# Patient Record
Sex: Male | Born: 1960 | Race: Black or African American | Hispanic: No | Marital: Single | Smoking: Former smoker
Health system: Southern US, Community
[De-identification: ages and names within clinical notes are randomized; demographics above are authoritative.]

## PROBLEM LIST (undated history)

## (undated) DIAGNOSIS — I509 Heart failure, unspecified: Secondary | ICD-10-CM

## (undated) DIAGNOSIS — E119 Type 2 diabetes mellitus without complications: Secondary | ICD-10-CM

## (undated) DIAGNOSIS — I2510 Atherosclerotic heart disease of native coronary artery without angina pectoris: Secondary | ICD-10-CM

## (undated) DIAGNOSIS — I82409 Acute embolism and thrombosis of unspecified deep veins of unspecified lower extremity: Secondary | ICD-10-CM

## (undated) DIAGNOSIS — I4891 Unspecified atrial fibrillation: Secondary | ICD-10-CM

## (undated) DIAGNOSIS — I10 Essential (primary) hypertension: Secondary | ICD-10-CM

## (undated) HISTORY — PX: NO PAST SURGERIES: SHX2092

---

## 2005-10-05 ENCOUNTER — Emergency Department: Payer: Self-pay | Admitting: Emergency Medicine

## 2009-01-28 ENCOUNTER — Ambulatory Visit: Payer: Self-pay | Admitting: Family Medicine

## 2010-02-15 ENCOUNTER — Inpatient Hospital Stay: Payer: Self-pay | Admitting: Internal Medicine

## 2011-11-13 ENCOUNTER — Inpatient Hospital Stay: Payer: Self-pay | Admitting: Internal Medicine

## 2011-11-13 LAB — HEMOGLOBIN A1C: Hemoglobin A1C: 13.8 % — ABNORMAL HIGH (ref 4.2–6.3)

## 2011-11-13 LAB — MAGNESIUM: Magnesium: 1.5 mg/dL — ABNORMAL LOW

## 2011-11-13 LAB — TROPONIN I: Troponin-I: 0.02 ng/mL

## 2011-11-14 LAB — CBC WITH DIFFERENTIAL/PLATELET
Basophil #: 0.1 10*3/uL (ref 0.0–0.1)
Basophil %: 0.9 %
Eosinophil #: 0 10*3/uL (ref 0.0–0.7)
HCT: 43.2 % (ref 40.0–52.0)
HGB: 14.3 g/dL (ref 13.0–18.0)
Lymphocyte %: 33.6 %
MCH: 31.7 pg (ref 26.0–34.0)
MCHC: 33.2 g/dL (ref 32.0–36.0)
Monocyte #: 0.6 x10 3/mm (ref 0.2–1.0)
Neutrophil #: 3.3 10*3/uL (ref 1.4–6.5)
Neutrophil %: 56 %
RDW: 14.8 % — ABNORMAL HIGH (ref 11.5–14.5)

## 2011-11-14 LAB — BASIC METABOLIC PANEL
BUN: 17 mg/dL (ref 7–18)
Calcium, Total: 7.8 mg/dL — ABNORMAL LOW (ref 8.5–10.1)
Chloride: 101 mmol/L (ref 98–107)
Creatinine: 1.68 mg/dL — ABNORMAL HIGH (ref 0.60–1.30)
EGFR (African American): 53 — ABNORMAL LOW
EGFR (Non-African Amer.): 46 — ABNORMAL LOW
Glucose: 310 mg/dL — ABNORMAL HIGH (ref 65–99)
Potassium: 3.7 mmol/L (ref 3.5–5.1)
Sodium: 135 mmol/L — ABNORMAL LOW (ref 136–145)

## 2011-11-14 LAB — MAGNESIUM
Magnesium: 2 mg/dL
Magnesium: 2 mg/dL

## 2011-11-14 LAB — PROTIME-INR
INR: 1.9
Prothrombin Time: 22.4 secs — ABNORMAL HIGH (ref 11.5–14.7)

## 2011-11-14 LAB — COMPREHENSIVE METABOLIC PANEL
Alkaline Phosphatase: 59 U/L (ref 50–136)
Anion Gap: 9 (ref 7–16)
BUN: 12 mg/dL (ref 7–18)
Calcium, Total: 7.9 mg/dL — ABNORMAL LOW (ref 8.5–10.1)
Creatinine: 0.97 mg/dL (ref 0.60–1.30)
EGFR (African American): >60
EGFR (Non-African Amer.): >60
Glucose: 206 mg/dL — ABNORMAL HIGH (ref 65–99)
Osmolality: 281 (ref 275–301)
Potassium: 3.3 mmol/L — ABNORMAL LOW (ref 3.5–5.1)
SGPT (ALT): 37 U/L (ref 12–78)
Sodium: 138 mmol/L (ref 136–145)

## 2011-11-15 LAB — BASIC METABOLIC PANEL
Anion Gap: 6 — ABNORMAL LOW (ref 7–16)
BUN: 17 mg/dL (ref 7–18)
Chloride: 104 mmol/L (ref 98–107)
Co2: 27 mmol/L (ref 21–32)
Osmolality: 278 (ref 275–301)
Potassium: 3.4 mmol/L — ABNORMAL LOW (ref 3.5–5.1)
Sodium: 137 mmol/L (ref 136–145)

## 2011-11-16 LAB — BASIC METABOLIC PANEL
Anion Gap: 8 (ref 7–16)
Calcium, Total: 8.3 mg/dL — ABNORMAL LOW (ref 8.5–10.1)
Chloride: 106 mmol/L (ref 98–107)
Co2: 24 mmol/L (ref 21–32)
Creatinine: 1.13 mg/dL (ref 0.60–1.30)
EGFR (African American): >60
EGFR (Non-African Amer.): >60
Osmolality: 282 (ref 275–301)

## 2011-11-17 LAB — PROTIME-INR
INR: 1.6
Prothrombin Time: 19.7 secs — ABNORMAL HIGH (ref 11.5–14.7)

## 2011-11-18 LAB — BASIC METABOLIC PANEL
BUN: 21 mg/dL — ABNORMAL HIGH (ref 7–18)
Calcium, Total: 8.3 mg/dL — ABNORMAL LOW (ref 8.5–10.1)
Co2: 23 mmol/L (ref 21–32)
Creatinine: 1.07 mg/dL (ref 0.60–1.30)
EGFR (Non-African Amer.): >60
Glucose: 116 mg/dL — ABNORMAL HIGH (ref 65–99)
Osmolality: 278 (ref 275–301)
Potassium: 3.9 mmol/L (ref 3.5–5.1)
Sodium: 137 mmol/L (ref 136–145)

## 2012-02-17 ENCOUNTER — Observation Stay: Payer: Self-pay | Admitting: Student

## 2012-02-17 LAB — URINALYSIS, COMPLETE
Bacteria: NONE SEEN
Glucose,UR: >=500 mg/dL (ref 0–75)
Leukocyte Esterase: NEGATIVE
Nitrite: NEGATIVE
RBC,UR: 1 /HPF (ref 0–5)
Squamous Epithelial: NONE SEEN
WBC UR: 1 /HPF (ref 0–5)

## 2012-02-17 LAB — TROPONIN I: Troponin-I: <0.02 ng/mL

## 2012-02-17 LAB — PROTIME-INR
INR: 1.9
Prothrombin Time: 21.7 secs — ABNORMAL HIGH (ref 11.5–14.7)

## 2012-02-17 LAB — LIPID PANEL
Cholesterol: 172 mg/dL (ref 0–200)
HDL Cholesterol: 52 mg/dL (ref 40–60)
Ldl Cholesterol, Calc: 87 mg/dL (ref 0–100)
Triglycerides: 167 mg/dL (ref 0–200)

## 2012-02-17 LAB — COMPREHENSIVE METABOLIC PANEL
Albumin: 3.4 g/dL (ref 3.4–5.0)
Alkaline Phosphatase: 78 U/L (ref 50–136)
Bilirubin,Total: 0.5 mg/dL (ref 0.2–1.0)
Co2: 28 mmol/L (ref 21–32)
EGFR (African American): >60
EGFR (Non-African Amer.): >60
SGOT(AST): 18 U/L (ref 15–37)
SGPT (ALT): 21 U/L (ref 12–78)
Total Protein: 7.9 g/dL (ref 6.4–8.2)

## 2012-02-17 LAB — CBC
HCT: 45.9 % (ref 40.0–52.0)
MCH: 31 pg (ref 26.0–34.0)
MCV: 91 fL (ref 80–100)
Platelet: 223 10*3/uL (ref 150–440)
RBC: 5.03 10*6/uL (ref 4.40–5.90)
RDW: 16.9 % — ABNORMAL HIGH (ref 11.5–14.5)

## 2012-02-17 LAB — HEMOGLOBIN A1C: Hemoglobin A1C: 14.5 % — ABNORMAL HIGH (ref 4.2–6.3)

## 2012-02-18 LAB — LIPID PANEL
Cholesterol: 168 mg/dL (ref 0–200)
HDL Cholesterol: 52 mg/dL (ref 40–60)
Ldl Cholesterol, Calc: 92 mg/dL (ref 0–100)
Triglycerides: 122 mg/dL (ref 0–200)

## 2013-09-28 ENCOUNTER — Emergency Department: Payer: Self-pay | Admitting: Emergency Medicine

## 2013-10-11 ENCOUNTER — Inpatient Hospital Stay: Payer: Self-pay | Admitting: Internal Medicine

## 2013-10-11 LAB — COMPREHENSIVE METABOLIC PANEL
ALT: 28 U/L (ref 12–78)
Albumin: 3 g/dL — ABNORMAL LOW (ref 3.4–5.0)
Alkaline Phosphatase: 72 U/L
Anion Gap: 9 (ref 7–16)
BUN: 11 mg/dL (ref 7–18)
Bilirubin,Total: 1.7 mg/dL — ABNORMAL HIGH (ref 0.2–1.0)
CREATININE: 1.58 mg/dL — AB (ref 0.60–1.30)
Calcium, Total: 7.7 mg/dL — ABNORMAL LOW (ref 8.5–10.1)
Chloride: 105 mmol/L (ref 98–107)
Co2: 22 mmol/L (ref 21–32)
EGFR (African American): 57 — ABNORMAL LOW
EGFR (Non-African Amer.): 49 — ABNORMAL LOW
GLUCOSE: 217 mg/dL — AB (ref 65–99)
Osmolality: 278 (ref 275–301)
POTASSIUM: 4.8 mmol/L (ref 3.5–5.1)
SGOT(AST): 33 U/L (ref 15–37)
Sodium: 136 mmol/L (ref 136–145)
Total Protein: 7 g/dL (ref 6.4–8.2)

## 2013-10-11 LAB — BASIC METABOLIC PANEL
ANION GAP: 8 (ref 7–16)
Anion Gap: 12 (ref 7–16)
BUN: 12 mg/dL (ref 7–18)
BUN: 14 mg/dL (ref 7–18)
CHLORIDE: 102 mmol/L (ref 98–107)
CO2: 19 mmol/L — AB (ref 21–32)
CO2: 20 mmol/L — AB (ref 21–32)
CREATININE: 1.6 mg/dL — AB (ref 0.60–1.30)
Calcium, Total: 7.1 mg/dL — ABNORMAL LOW (ref 8.5–10.1)
Calcium, Total: 8 mg/dL — ABNORMAL LOW (ref 8.5–10.1)
Chloride: 100 mmol/L (ref 98–107)
Creatinine: 1.68 mg/dL — ABNORMAL HIGH (ref 0.60–1.30)
EGFR (Non-African Amer.): 45 — ABNORMAL LOW
EGFR (Non-African Amer.): 48 — ABNORMAL LOW
GFR CALC AF AMER: 53 — AB
GFR CALC AF AMER: 56 — AB
GLUCOSE: 272 mg/dL — AB (ref 65–99)
Glucose: 324 mg/dL — ABNORMAL HIGH (ref 65–99)
OSMOLALITY: 268 (ref 275–301)
Osmolality: 278 (ref 275–301)
Potassium: 7.2 mmol/L (ref 3.5–5.1)
Potassium: 4.3 mmol/L (ref 3.5–5.1)
SODIUM: 129 mmol/L — AB (ref 136–145)
Sodium: 132 mmol/L — ABNORMAL LOW (ref 136–145)

## 2013-10-11 LAB — CBC
HCT: 46.7 % (ref 40.0–52.0)
HGB: 14.9 g/dL (ref 13.0–18.0)
MCH: 31.4 pg (ref 26.0–34.0)
MCHC: 31.9 g/dL — AB (ref 32.0–36.0)
MCV: 98 fL (ref 80–100)
Platelet: 245 10*3/uL (ref 150–440)
RBC: 4.74 10*6/uL (ref 4.40–5.90)
RDW: 14.6 % — AB (ref 11.5–14.5)
WBC: 6.8 10*3/uL (ref 3.8–10.6)

## 2013-10-11 LAB — URINALYSIS, COMPLETE
BACTERIA: NONE SEEN
Bilirubin,UR: NEGATIVE
Blood: NEGATIVE
Glucose,UR: NEGATIVE mg/dL (ref 0–75)
Ketone: NEGATIVE
LEUKOCYTE ESTERASE: NEGATIVE
Nitrite: NEGATIVE
Ph: 5 (ref 4.5–8.0)
Protein: 30
SPECIFIC GRAVITY: 1.02 (ref 1.003–1.030)
Squamous Epithelial: NONE SEEN
WBC UR: 1 /HPF (ref 0–5)

## 2013-10-11 LAB — TROPONIN I
TROPONIN-I: 0.02 ng/mL
Troponin-I: 0.02 ng/mL

## 2013-10-11 LAB — DIGOXIN LEVEL: Digoxin: 1.45 ng/mL

## 2013-10-11 LAB — D-DIMER(ARMC): D-Dimer: 1781 ng/ml

## 2013-10-11 LAB — PROTIME-INR
INR: 1.4
PROTHROMBIN TIME: 16.6 s — AB (ref 11.5–14.7)

## 2013-10-11 LAB — CK TOTAL AND CKMB (NOT AT ARMC)
CK, TOTAL: 268 U/L
CK-MB: 4.7 ng/mL — ABNORMAL HIGH (ref 0.5–3.6)

## 2013-10-11 LAB — PRO B NATRIURETIC PEPTIDE: B-TYPE NATIURETIC PEPTID: 1023 pg/mL — AB (ref 0–125)

## 2013-10-12 ENCOUNTER — Ambulatory Visit: Payer: Self-pay | Admitting: Neurology

## 2013-10-12 LAB — BASIC METABOLIC PANEL
ANION GAP: 10 (ref 7–16)
BUN: 13 mg/dL (ref 7–18)
CO2: 22 mmol/L (ref 21–32)
Calcium, Total: 7.7 mg/dL — ABNORMAL LOW (ref 8.5–10.1)
Chloride: 108 mmol/L — ABNORMAL HIGH (ref 98–107)
Creatinine: 1.58 mg/dL — ABNORMAL HIGH (ref 0.60–1.30)
EGFR (African American): 57 — ABNORMAL LOW
EGFR (Non-African Amer.): 49 — ABNORMAL LOW
GLUCOSE: 177 mg/dL — AB (ref 65–99)
OSMOLALITY: 284 (ref 275–301)
Potassium: 3.7 mmol/L (ref 3.5–5.1)
SODIUM: 140 mmol/L (ref 136–145)

## 2013-10-12 LAB — CBC WITH DIFFERENTIAL/PLATELET
Basophil #: 0.1 10*3/uL (ref 0.0–0.1)
Basophil %: 0.7 %
EOS PCT: 0.1 %
Eosinophil #: 0 10*3/uL (ref 0.0–0.7)
HCT: 43.3 % (ref 40.0–52.0)
HGB: 14.2 g/dL (ref 13.0–18.0)
LYMPHS PCT: 17.7 %
Lymphocyte #: 1.7 10*3/uL (ref 1.0–3.6)
MCH: 31.7 pg (ref 26.0–34.0)
MCHC: 32.8 g/dL (ref 32.0–36.0)
MCV: 96 fL (ref 80–100)
Monocyte #: 0.4 x10 3/mm (ref 0.2–1.0)
Monocyte %: 4 %
NEUTROS PCT: 77.5 %
Neutrophil #: 7.4 10*3/uL — ABNORMAL HIGH (ref 1.4–6.5)
PLATELETS: 165 10*3/uL (ref 150–440)
RBC: 4.49 10*6/uL (ref 4.40–5.90)
RDW: 14.6 % — ABNORMAL HIGH (ref 11.5–14.5)
WBC: 9.5 10*3/uL (ref 3.8–10.6)

## 2013-10-12 LAB — TSH: Thyroid Stimulating Horm: 0.533 u[IU]/mL

## 2013-10-12 LAB — MAGNESIUM
Magnesium: 1.5 mg/dL — ABNORMAL LOW
Magnesium: 2 mg/dL

## 2013-10-13 LAB — BASIC METABOLIC PANEL
Anion Gap: 8 (ref 7–16)
BUN: 10 mg/dL (ref 7–18)
CHLORIDE: 107 mmol/L (ref 98–107)
CO2: 25 mmol/L (ref 21–32)
Calcium, Total: 7.3 mg/dL — ABNORMAL LOW (ref 8.5–10.1)
Creatinine: 1.09 mg/dL (ref 0.60–1.30)
GLUCOSE: 120 mg/dL — AB (ref 65–99)
Osmolality: 280 (ref 275–301)
POTASSIUM: 3.4 mmol/L — AB (ref 3.5–5.1)
Sodium: 140 mmol/L (ref 136–145)

## 2013-10-13 LAB — CBC WITH DIFFERENTIAL/PLATELET
Basophil #: 0.1 10*3/uL (ref 0.0–0.1)
Basophil %: 0.9 %
Eosinophil #: 0 10*3/uL (ref 0.0–0.7)
Eosinophil %: 0.2 %
HCT: 43.3 % (ref 40.0–52.0)
HGB: 13.9 g/dL (ref 13.0–18.0)
LYMPHS ABS: 1.3 10*3/uL (ref 1.0–3.6)
Lymphocyte %: 15.5 %
MCH: 31.4 pg (ref 26.0–34.0)
MCHC: 32.1 g/dL (ref 32.0–36.0)
MCV: 98 fL (ref 80–100)
MONOS PCT: 4.4 %
Monocyte #: 0.4 x10 3/mm (ref 0.2–1.0)
NEUTROS ABS: 6.7 10*3/uL — AB (ref 1.4–6.5)
Neutrophil %: 79 %
Platelet: 144 10*3/uL — ABNORMAL LOW (ref 150–440)
RBC: 4.43 10*6/uL (ref 4.40–5.90)
RDW: 15.1 % — AB (ref 11.5–14.5)
WBC: 8.5 10*3/uL (ref 3.8–10.6)

## 2013-10-13 LAB — URINALYSIS, COMPLETE
Bacteria: NONE SEEN
Bilirubin,UR: NEGATIVE
Glucose,UR: NEGATIVE mg/dL (ref 0–75)
KETONE: NEGATIVE
Leukocyte Esterase: NEGATIVE
Nitrite: NEGATIVE
Ph: 5 (ref 4.5–8.0)
Protein: 30
Specific Gravity: 1.026 (ref 1.003–1.030)
Squamous Epithelial: NONE SEEN
WBC UR: 1 /HPF (ref 0–5)

## 2013-10-14 LAB — BASIC METABOLIC PANEL
ANION GAP: 8 (ref 7–16)
BUN: 9 mg/dL (ref 7–18)
CALCIUM: 7.4 mg/dL — AB (ref 8.5–10.1)
CREATININE: 1.29 mg/dL (ref 0.60–1.30)
Chloride: 104 mmol/L (ref 98–107)
Co2: 27 mmol/L (ref 21–32)
EGFR (African American): >60
EGFR (Non-African Amer.): >60
GLUCOSE: 164 mg/dL — AB (ref 65–99)
OSMOLALITY: 280 (ref 275–301)
Potassium: 3.7 mmol/L (ref 3.5–5.1)
SODIUM: 139 mmol/L (ref 136–145)

## 2013-10-14 LAB — URINALYSIS, COMPLETE
BACTERIA: NONE SEEN
BILIRUBIN, UR: NEGATIVE
Glucose,UR: NEGATIVE mg/dL (ref 0–75)
Ketone: NEGATIVE
Leukocyte Esterase: NEGATIVE
Nitrite: NEGATIVE
Ph: 5 (ref 4.5–8.0)
Protein: NEGATIVE
SQUAMOUS EPITHELIAL: NONE SEEN
Specific Gravity: 1.01 (ref 1.003–1.030)
WBC UR: 4 /HPF (ref 0–5)

## 2013-10-14 LAB — HEPATIC FUNCTION PANEL A (ARMC)
Albumin: 2.4 g/dL — ABNORMAL LOW (ref 3.4–5.0)
Alkaline Phosphatase: 82 U/L
BILIRUBIN TOTAL: 1.7 mg/dL — AB (ref 0.2–1.0)
Bilirubin, Direct: 1 mg/dL — ABNORMAL HIGH (ref 0.00–0.20)
SGOT(AST): 314 U/L — ABNORMAL HIGH (ref 15–37)
SGPT (ALT): 916 U/L — ABNORMAL HIGH (ref 12–78)
TOTAL PROTEIN: 6.8 g/dL (ref 6.4–8.2)

## 2013-10-14 LAB — PHOSPHORUS: Phosphorus: 3 mg/dL (ref 2.5–4.9)

## 2013-10-14 LAB — MAGNESIUM: Magnesium: 2 mg/dL

## 2013-10-14 LAB — URINE CULTURE

## 2013-10-14 LAB — CK: CK, TOTAL: 101 U/L

## 2013-10-15 LAB — BASIC METABOLIC PANEL
Anion Gap: 6 — ABNORMAL LOW (ref 7–16)
BUN: 7 mg/dL (ref 7–18)
CO2: 29 mmol/L (ref 21–32)
CREATININE: 1.13 mg/dL (ref 0.60–1.30)
Calcium, Total: 7.6 mg/dL — ABNORMAL LOW (ref 8.5–10.1)
Chloride: 103 mmol/L (ref 98–107)
EGFR (African American): >60
GLUCOSE: 178 mg/dL — AB (ref 65–99)
OSMOLALITY: 278 (ref 275–301)
POTASSIUM: 3.3 mmol/L — AB (ref 3.5–5.1)
SODIUM: 138 mmol/L (ref 136–145)

## 2013-10-15 LAB — CBC WITH DIFFERENTIAL/PLATELET
BASOS ABS: 0 10*3/uL (ref 0.0–0.1)
Basophil %: 0.3 %
EOS PCT: 0.2 %
Eosinophil #: 0 10*3/uL (ref 0.0–0.7)
HCT: 42 % (ref 40.0–52.0)
HGB: 13.6 g/dL (ref 13.0–18.0)
LYMPHS ABS: 0.8 10*3/uL — AB (ref 1.0–3.6)
Lymphocyte %: 10.6 %
MCH: 31.6 pg (ref 26.0–34.0)
MCHC: 32.5 g/dL (ref 32.0–36.0)
MCV: 97 fL (ref 80–100)
MONO ABS: 0.7 x10 3/mm (ref 0.2–1.0)
Monocyte %: 9.5 %
NEUTROS ABS: 5.8 10*3/uL (ref 1.4–6.5)
Neutrophil %: 79.4 %
PLATELETS: 139 10*3/uL — AB (ref 150–440)
RBC: 4.32 10*6/uL — ABNORMAL LOW (ref 4.40–5.90)
RDW: 14.8 % — ABNORMAL HIGH (ref 11.5–14.5)
WBC: 7.3 10*3/uL (ref 3.8–10.6)

## 2013-10-15 LAB — POTASSIUM: POTASSIUM: 3.5 mmol/L (ref 3.5–5.1)

## 2013-10-15 LAB — AMMONIA: Ammonia, Plasma: 36 mcmol/L — ABNORMAL HIGH (ref 11–32)

## 2013-10-16 LAB — BASIC METABOLIC PANEL
ANION GAP: 9 (ref 7–16)
BUN: 8 mg/dL (ref 7–18)
CO2: 30 mmol/L (ref 21–32)
Calcium, Total: 7.7 mg/dL — ABNORMAL LOW (ref 8.5–10.1)
Chloride: 101 mmol/L (ref 98–107)
Creatinine: 0.96 mg/dL (ref 0.60–1.30)
EGFR (Non-African Amer.): >60
Glucose: 137 mg/dL — ABNORMAL HIGH (ref 65–99)
OSMOLALITY: 280 (ref 275–301)
POTASSIUM: 3.5 mmol/L (ref 3.5–5.1)
SODIUM: 140 mmol/L (ref 136–145)

## 2013-10-16 LAB — MAGNESIUM: MAGNESIUM: 2 mg/dL

## 2013-10-16 LAB — PHOSPHORUS: Phosphorus: 1.9 mg/dL — ABNORMAL LOW (ref 2.5–4.9)

## 2013-10-17 LAB — BASIC METABOLIC PANEL
ANION GAP: 7 (ref 7–16)
BUN: 10 mg/dL (ref 7–18)
Calcium, Total: 8.1 mg/dL — ABNORMAL LOW (ref 8.5–10.1)
Chloride: 101 mmol/L (ref 98–107)
Co2: 30 mmol/L (ref 21–32)
Creatinine: 1.01 mg/dL (ref 0.60–1.30)
EGFR (African American): >60
EGFR (Non-African Amer.): >60
GLUCOSE: 174 mg/dL — AB (ref 65–99)
OSMOLALITY: 279 (ref 275–301)
Potassium: 3.1 mmol/L — ABNORMAL LOW (ref 3.5–5.1)
SODIUM: 138 mmol/L (ref 136–145)

## 2013-10-17 LAB — EXPECTORATED SPUTUM ASSESSMENT W REFEX TO RESP CULTURE

## 2013-10-17 LAB — POTASSIUM: POTASSIUM: 4.1 mmol/L (ref 3.5–5.1)

## 2013-10-17 LAB — PHOSPHORUS
PHOSPHORUS: 1.6 mg/dL — AB (ref 2.5–4.9)
PHOSPHORUS: 1.9 mg/dL — AB (ref 2.5–4.9)
Phosphorus: 1.9 mg/dL — ABNORMAL LOW (ref 2.5–4.9)

## 2013-10-17 LAB — MAGNESIUM: Magnesium: 2.2 mg/dL

## 2013-10-18 LAB — BASIC METABOLIC PANEL
Anion Gap: 7 (ref 7–16)
BUN: 16 mg/dL (ref 7–18)
CHLORIDE: 102 mmol/L (ref 98–107)
Calcium, Total: 8.2 mg/dL — ABNORMAL LOW (ref 8.5–10.1)
Co2: 30 mmol/L (ref 21–32)
Creatinine: 1.11 mg/dL (ref 0.60–1.30)
EGFR (Non-African Amer.): >60
GLUCOSE: 168 mg/dL — AB (ref 65–99)
Osmolality: 283 (ref 275–301)
POTASSIUM: 3.4 mmol/L — AB (ref 3.5–5.1)
SODIUM: 139 mmol/L (ref 136–145)

## 2013-10-18 LAB — PHOSPHORUS
PHOSPHORUS: 2.4 mg/dL — AB (ref 2.5–4.9)
Phosphorus: 2.4 mg/dL — ABNORMAL LOW (ref 2.5–4.9)

## 2013-10-18 LAB — MAGNESIUM: Magnesium: 2.1 mg/dL

## 2013-10-19 LAB — CULTURE, BLOOD (SINGLE)

## 2013-10-19 LAB — POTASSIUM: POTASSIUM: 3 mmol/L — AB (ref 3.5–5.1)

## 2013-10-19 LAB — SODIUM: SODIUM: 141 mmol/L (ref 136–145)

## 2013-10-19 LAB — PHOSPHORUS: Phosphorus: 2 mg/dL — ABNORMAL LOW (ref 2.5–4.9)

## 2013-10-19 LAB — CALCIUM: CALCIUM: 8.4 mg/dL — AB (ref 8.5–10.1)

## 2013-10-19 LAB — MAGNESIUM: Magnesium: 2 mg/dL

## 2013-10-20 LAB — BASIC METABOLIC PANEL
ANION GAP: 8 (ref 7–16)
BUN: 14 mg/dL (ref 7–18)
CHLORIDE: 105 mmol/L (ref 98–107)
Calcium, Total: 8.6 mg/dL (ref 8.5–10.1)
Co2: 29 mmol/L (ref 21–32)
Creatinine: 1.02 mg/dL (ref 0.60–1.30)
EGFR (African American): >60
Glucose: 149 mg/dL — ABNORMAL HIGH (ref 65–99)
Osmolality: 286 (ref 275–301)
POTASSIUM: 3 mmol/L — AB (ref 3.5–5.1)
Sodium: 142 mmol/L (ref 136–145)

## 2013-10-20 LAB — DIGOXIN LEVEL: Digoxin: 0.82 ng/mL

## 2013-10-20 LAB — PHOSPHORUS: PHOSPHORUS: 2.4 mg/dL — AB (ref 2.5–4.9)

## 2013-10-20 LAB — MAGNESIUM: Magnesium: 1.8 mg/dL

## 2013-10-20 LAB — HEMOGLOBIN: HGB: 14.3 g/dL (ref 13.0–18.0)

## 2014-04-12 ENCOUNTER — Emergency Department: Payer: Self-pay | Admitting: Physician Assistant

## 2014-07-14 NOTE — Discharge Summary (Signed)
PATIENT NAME:  Ralph Meadows, Ralph Meadows MR#:  [id number] DATE OF BIRTH:  Apr 14, 1960  DATE OF ADMISSION:  11/13/2011 DATE OF DISCHARGE:  11/18/2011  PRIMARY CARE PHYSICIAN: Dr. Terance Hart CARDIOLOGIST: Dr. Darrold Junker   FINAL DIAGNOSES:  1. Acute systolic congestive heart failure with cardiomyopathy, ejection fraction of 35%.  2. Rapid atrial fibrillation.  3. Hypotension.  4. Diabetes.  5. Obesity.   MEDICATIONS ON DISCHARGE:  1. Cardizem CD 240 mg extended-release 1 capsule daily.  2. Novolin 70/30 insulin 10 units twice a day. 3. Warfarin 7.5 mg today only on 08/24 then go back to 5 mg p.o. daily afterwards. 4. Digoxin 250 mg p.o. daily.  5. Carvedilol 6.25 mg twice a day. 6. Ipratropium albuterol CFC free inhaler 100 mcg/20 mcg inhalation 1 puff 4 times a day as needed for shortness of breath.  7. Furosemide 40 mg daily.  8. Potassium chloride 20 mEq daily.  9. Stop hydrochlorothiazide/lisinopril combination.   DIET: Low sodium, carbohydrate-controlled diet, regular consistency.   ACTIVITY: Activity as tolerated.   FOLLOW UP: Keep appointment with Dr. Darrold Junker in 1 to 2 weeks. Keep appointment with Dr. Terance Hart on Friday. Check Coumadin level on Monday or Tuesday with Dr. Mcarthur Rossetti office.   REASON FOR ADMISSION: Patient was admitted 11/13/2011, discharged 11/18/2011. Came in with rapid atrial fibrillation and congestive heart failure, two days of shortness of breath and wheezing. He had a recent pneumonia that was treated with antibiotics. Over the past few weeks he has gained 20 pounds and can only walk 10 to 20 feet without getting shortness of breath. He was admitted with acute congestive heart failure, started on IV Lasix, initially admitted to telemetry for the rapid atrial fibrillation.   LABORATORY, DIAGNOSTIC AND RADIOLOGICAL DATA: Hemoglobin A1c 13.8. Troponin borderline at 0.02. BNP 655, magnesium 1.5. Chest x-ray showed no definite acute cardiopulmonary disease, mild  cardiomegaly. INR 1.9. Glucose 206, BUN 12, creatinine 0.97, sodium 138, potassium 3.3, chloride 104, CO2 25, calcium 7.9. Liver function tests: Albumin low at 2.8. Other liver function tests normal. White blood cell count 5.9, hemoglobin and hematocrit 14.3 and 43.2, platelet count 226. EKG showed atrial fibrillation, nonspecific T wave abnormality. Echocardiogram showed an ejection fraction of 35%, left ventricular hypertrophy, left atrial enlargement, right ventricular enlargement, mild MR and TR. Last creatinine on the 24th was 1.07. Last potassium 3.9. Last INR on the 23rd was 1.6.   HOSPITAL COURSE PER PROBLEM LIST:  1. For the patient's acute systolic congestive heart failure with cardiomyopathy ejection fraction of 35%, the patient was started on Lasix 40 mg daily, Coreg 6.25 mg twice a day. Unable to give ACE inhibitor secondary to hypotension. Blood pressure 97/68 upon discharge. I think the heart failure was driven by the rapid atrial fibrillation.  2. Rapid atrial fibrillation. The patient needed three medications to control heart rate; digoxin, Cardizem and Coreg. Heart rate ranging between 79 and 101. Close clinical follow up as outpatient needed.  3. Hypotension. We are limited with medications in this gentleman secondary to hypotension. Need to control heart rate which is a priority because I think his heart failure is driven by the fast heart rate. Would ideally like to add a low dose ACE inhibitor for the patient's cardiomyopathy and congestive heart failure but unable to do so in the hospital stay. Hopefully will be able to do so as outpatient.  4. For his diabetes, this is under poor control with hemoglobin A1c being 13.8, although the last few sugars have been good; glucose 116  and 119 and that was on his usual medication this suggests noncompliance.  5. Obesity. BMI 50.6. Weight loss needed.         TIME SPENT ON DISCHARGE: 35 minutes.   ____________________________ Herschell Dimesichard J.  Renae GlossWieting, MD rjw:cms D: 11/18/2011 14:45:52 ET T: 11/18/2011 14:55:18 ET JOB#: [id number]  cc: Herschell Dimesichard J. Renae GlossWieting, MD, <Dictator> Teena Iraniavid M. Terance HartBronstein, MD Marcina MillardAlexander Paraschos, MD  Salley ScarletICHARD J Nayshawn Mesta MD ELECTRONICALLY SIGNED 11/22/2011 16:49

## 2014-07-14 NOTE — Consult Note (Signed)
PATIENT NAME:  Ralph Meadows, Ralph Meadows MR#:  [id number] DATE OF BIRTH:  August 31, 1960  DATE OF CONSULTATION:  11/14/2011  REFERRING PHYSICIAN:  Dr. Cherlynn Kaiser  CONSULTING PHYSICIAN:  Marcina Millard, MD  PRIMARY CARE PHYSICIAN:  Dr. Terance Hart.   CHIEF COMPLAINT: Shortness of breath.   REASON FOR CONSULTATION: Consultation requested for evaluation of congestive heart failure and atrial fibrillation.   HISTORY OF PRESENT ILLNESS: The patient is a 54 year old gentleman with a history of paroxysmal atrial fibrillation, hypertension, diabetes and prior history of stroke. The patient apparently has had a recent history of pneumonia treated as an outpatient. He presented to urgent care clinic on 11/13/2011 with a two week history of increasing shortness of breath with 20 pound weight gain. The patient was admitted to the Critical Care Unit with congestive heart failure where he has been treated with intravenous furosemide. The patient has demonstrated diuresis and clinical improvement. Admission labs were notable for a negative troponin of 0.02 in the absence of chest pain. B-type natriuretic peptide was 655. The patient is in atrial fibrillation.   PAST MEDICAL HISTORY:  1. Paroxysmal atrial fibrillation.  2. Hypertension.  3. Diabetes.  4. Extreme obesity.  5. History of cerebrovascular accident.  6. Hyperlipidemia.   MEDICATIONS:  1. Aspirin 81 mg daily.  2. Diltiazem CD 240 mg daily.  3. Coumadin 5 mg daily.  4. Lipitor 20 mg daily.  5. Metoprolol 10 mg b.i.d.  6. Norvasc 5 mg daily.  7. Glyburide 5 mg b.i.d.  8. Novolin 70/30, 20 units daily.   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 He has a 15 pack-year tobacco abuse history, quit in 2006.   FAMILY HISTORY:  No family history of coronary disease or myocardial infarction.   REVIEW OF SYSTEMS: CONSTITUTIONAL: No fever or chills. EYES: No blurry vision. EARS: No hearing loss. RESPIRATORY: Shortness of breath  as described above. CARDIOVASCULAR: No chest pain. GASTROINTESTINAL: The patient does have some mild nausea. Denies vomiting, diarrhea, or constipation. GU: The patient denies dysuria or hematuria. ENDOCRINE: The patient denies polyuria or polydipsia. MUSCULOSKELETAL: The patient denies arthralgias or myalgias. INTEGUMENT: The patient denies rashes. NEUROLOGICAL: The patient denies focal muscle weakness or numbness. PSYCHOLOGICAL: The patient denies depression or anxiety.   PHYSICAL EXAMINATION:  VITAL SIGNS: Blood pressure 108/65, pulse 110 and irregular, respirations 34, temperature 98.6, pulse oximetry 98%.   HEENT: Pupils equal, reactive to light and accommodation.   NECK: Supple without thyromegaly.   LUNGS: Clear.   HEART: Normal jugular venous pressure. Normal point of maximal impulse. Regular rate and rhythm. Normal S1, S2. No appreciable gallop, murmur, or rub.   ABDOMEN: Soft and nontender.   EXTREMITIES: 1+ bilateral pedal edema.   MUSCULOSKELETAL: Normal muscle tone.   NEUROLOGIC: The patient is alert and oriented x3. Motor and sensory both grossly intact.   IMPRESSION: This is a 54 year old gentleman who presents with a two week history of worsening congestive heart failure, likely diastolic dysfunction with history of obesity, hypertension and diabetes with paroxysmal atrial fibrillation. The patient has ruled out for myocardial infarction with negative troponin.   RECOMMENDATIONS:  1. Agree with overall current therapy.  2. Will continue diuresis with intravenous Lasix.  3. Continue warfarin, the patient has a CHADS2 score of 4.  4. Agree with adding metoprolol for better rate control.  5. Review 2-D echocardiogram.  6. Would defer adding antiarrhythmic therapy at this time.   ____________________________ Marcina Millard, MD ap:ap D: 11/14/2011 04:54:09 ET  T: 11/14/2011 10:46:16 ET JOB#: [id number]  cc: Marcina MillardAlexander Jawaan Adachi, MD, <Dictator> Marcina MillardALEXANDER Drezden Seitzinger  MD ELECTRONICALLY SIGNED 11/29/2011 9:00

## 2014-07-14 NOTE — Discharge Summary (Signed)
PATIENT NAME:  Ralph Meadows, COMUNALE MR#:  [id number] DATE OF BIRTH:  06-20-1960  DATE OF ADMISSION:  02/17/2012 DATE OF DISCHARGE:  02/19/2012  PRIMARY CARE PHYSICIAN: Dorothey Baseman, MD  CHIEF COMPLAINT: Speech problems and numbness.  DISCHARGE DIAGNOSES:  1. Possible transient ischemic attack, but less likely.  2. Uncontrolled diabetes.  3. Chronic systolic congestive heart failure.  4. Atrial fibrillation.  5. Possible peripheral neuropathy.  6. Benign hypertension.  7. Uncontrolled diabetes.  8. Dyslipidemia.  9. History of cerebrovascular accident.  10. History of congestive heart failure.   DISCHARGE MEDICATIONS:  1. Cardizem CD 240 mg extended-release one tab daily.  2. Digoxin 250 mcg daily.  3. Coreg 6.25 mg two times a day.  4. Albuterol/ipratropium CFC free one puff four times daily as needed for shortness of breath.  5. Lasix 40 mg daily.  6. Potassium chloride 20 mEq daily.  7. Warfarin 5 mg daily, except Mondays and Fridays, which the patient is to take 7.5 mg dose.  8. Insulin 70/30, 28 units twice a day with meals, once the morning and once at night.  9. Simvastatin 20 mg daily.  10. Lisinopril 2.5 mg daily.   DIET: Low sodium, low fat, low cholesterol, ADA diet.   ACTIVITY: As tolerated.   DISCHARGE FOLLOWUP: Please follow with hour PCP and cardiologist within 1 to 2 weeks.   DISPOSITION: Home.   HISTORY OF PRESENT ILLNESS: For full details of History and Physical, please see the dictation dictated on 02/17/2012 by Dr. Altamese Dilling, but briefly this is a pleasant 54 year old male with history of congestive heart failure, hypertension, diabetes, and cerebrovascular accident in the past who presented with the above chief complaint. The patient upon awakening found his speech to be not his usual. He also felt some numbness in the right forearm. Apparently the numbness had been going on for a few days, but it was worse that day. He presented to the hospital  and was admitted to the hospitalist service for further evaluation and management for possible transient ischemic attack.   SIGNIFICANT LABS AND IMAGING: Initial BNP was 601, sodium 134, Hemoglobin A1c was 14.5, LFTs within normal limits, troponin negative x1, WBC on arrival normal, hemoglobin and hematocrit normal, platelets normal, and INR on arrival 1.9 and at discharge was 1.5.   Urinalysis was not suggestive of infection.   CT of head without contrast showed chronic changes without evidence of acute abnormalities.   Ultrasound of the carotids showed no significant stenosis.   A MRI of the brain showed no acute intracranial pathology. There was old left parietal and right temporal lobe infarct with encephalomalacia. No hemorrhage.   HOSPITAL COURSE: The patient was admitted to the hospitalist service. The patient stated that his speech impairment lasted a minute or so and was more like the patient being groggy after waking up. His tingling in his right upper extremity has persisted, however improved. He did have a negative CT and MRI of the brain which is likely not a stroke, although possible. I suspect the patient has some peripheral neuropathy given his history of diabetes and uncontrolled blood sugars and significantly elevated Hemoglobin A1c. As he was not on a statin, he was started on one. Furthermore, his Coumadin was resumed. His INR is slightly subtherapeutic; however, he states that this dose usually does work for him. An echocardiogram, which was done in August, showed an ejection fraction of 35% and no PEFR noted. This was not repeated this admission. The patient does have  chronic systolic congestive heart failure. Outpatient medications were continued and a low dose ACE inhibitor was added and the patient's blood pressure generally tolerated it well. His insulin regimen was uptitrated. The patient's 70/30 was increased to 28 units before meals twice a day by discharge. His right upper  extremity numbness likely has a component of peripheral neuropathy as he also has had some chronic numbness in his fingers. At this point, he will be discharged with outpatient follow-up as above.   CODE STATUS: FULL CODE.  TOTAL TIME SPENT: 35 minutes.  ____________________________ Krystal EatonShayiq Keiarra Charon, MD sa:slb D: 02/20/2012 15:39:01 ET T: 02/21/2012 10:23:58 ET JOB#: [id number]  cc: Krystal EatonShayiq Kymberly Blomberg, MD, <Dictator> Teena Iraniavid M. Terance HartBronstein, MD Krystal EatonSHAYIQ Chas Axel MD ELECTRONICALLY SIGNED 03/07/2012 11:10

## 2014-07-14 NOTE — Consult Note (Signed)
Brief Consult Note: Diagnosis: CHF, improving, probable diastolic dysfunction, AF with RVR.   Patient was seen by consultant.   Consult note dictated.   Comments: REC  Agree with current therapy, cont diuresis, uptitrate metoprolol, defer amio at this time.  Electronic Signatures: Marcina MillardParaschos, Damontae Loppnow (MD)  (Signed 20-Aug-13 09:12)  Authored: Brief Consult Note   Last Updated: 20-Aug-13 09:12 by Marcina MillardParaschos, Shanayah Kaffenberger (MD)

## 2014-07-14 NOTE — H&P (Signed)
PATIENT NAME:  Ralph Meadows, Ralph Meadows MR#:  [id number] DATE OF BIRTH:  03-23-1961  DATE OF ADMISSION:  11/13/2011  REASON FOR ADMISSION: CHF exacerbation and rapid ventricular response, atrial fibrillation.   PRIMARY CARE PHYSICIAN: Dorothey Baseman, MD   REFERRING PHYSICIAN: Dr. Fay Records from [facility]    HISTORY OF PRESENT ILLNESS: Ralph Meadows is a very nice 54 year old gentleman who has history of paroxysmal atrial fibrillation, hypertension, diabetes, and acute stroke in 2011. Ralph Meadows presented today to the Urgent Care Clinic with a history of two days of increased shortness of breath and wheezing. The patient states that two weeks ago he developed pneumonia that was treated with antibiotics for 10 days and the symptoms felt pretty much the same way as far as the shortness of breath, although this time it did not improve with inhalers. The only thing that he noticed is different is that within the past two weeks he has gained about 20 pounds and has noticed increase of swelling of his lower extremities. He is also very short of breath when he ambulates and he is having a little bit of a Orthopnea for what he cannot lay down flat. The patient says that he is very flexible and walks around without getting shortness of breath on a regular basis but within the past two weeks it has progressed to the point that he cannot go more than 10 to 20 feet without feeling a little bit more shortness of breath and start wheezing. The patient denies any chest pain or significant tightness of his chest. He denies any headaches and denies any changes of his medications or his diet. The patient states that he has been trying to eat foods without salt and with low amount of sugars. He has not really done anything much different than he always does. The patient was admitted as a direct admission and at this moment he is stable. He is able to sustain a conversation and he has not so far been given any  medications through the Urgent Care.   REVIEW OF SYSTEMS: CONSTITUTIONAL: The patient denies any fever, chills. Denies any significant pain. He states he had about a 20 pound weight gain due to edema and fluid retention. He denies any significant weight loss in the past. EYES: The patient denies any blurry vision, scotomas, floaters, irritation or pain in his eyes. ENT: The patient denies any otalgia, vertigo, dizziness. Denies any hearing loss. No oropharyngeal exudates. No increased nasal drainage or postnasal drainage. Denies any signs of upper respiratory infections since his last pneumonia. NECK: Denies any masses or any pain in his neck. CARDIOVASCULAR: As mentioned above, he denies any chest pain or tightness. He does not really notice palpitations but he's pretty much always in atrial fibrillation. He denies any previous exertional dyspnea but now he cannot walk more than 20 feet without getting short of breath and wheezing. He has some orthopnea as well. RESPIRATORY: Positive shortness of breath as mentioned above. No cough. No sputum. Positive pneumonia two weeks ago, treated with antibiotics. The patient does not remember which one. No hemoptysis. No COPD. The patient states that in the past has been treated for allergies which he thought was asthma and he was given some inhalers. GI: The patient denies any difficulty swallowing but he states that he has lack of appetite. Nothing tastes good since he had pneumonia. Denies any nausea, vomiting, constipation. No diarrhea. No melena. No hematemesis. GENITOURINARY: Denies any dysuria, hematuria, kidney stones. ENDOCRINOLOGY: The  patient has history of diabetes but denies any polyuria, polydipsia, polyphagia. His blood sugar has been running a little bit high. In the past his hemoglobin A1c has been up to 13 but he has been on insulin since then. He does not recall what his hemoglobin A1c is but his blood sugars occasionally run in the 200's. Denies any  hematologic problems like bleeding, masses, or ganglia has been inflamed. No petechiae. The patient is on Coumadin and his INR is therapeutic. His dose of Coumadin has been recently decreased to 5 mg. MUSCULOSKELETAL: Denies any significant pain of muscles or joints. He just feel like his legs are tight due to edema. NEUROLOGIC: Denies any headaches, tinnitus, any dizziness, any paresthesias or paralysis. He does not have any residual symptoms from his stroke that happened two years ago. MOOD: Denies any depression, anxiety, or insomnia.   PAST MEDICAL HISTORY:  1. Chronic atrial fibrillation.  2. Benign hypertension.  3. Type II diabetes, insulin dependent.  4. Dyslipidemia.  5. History of CVA.   ALLERGIES: The patient is allergic to latex.   FAMILY HISTORY: Positive for stroke on himself and also on his brother who died recently. There is also a history of sudden death in another brother who died from cardiovascular arrest. He denies any cancer but there is some diabetes and hypertension running in his family.   SOCIAL HISTORY: The patient used to smoke 5 to 6 cigarettes a day but he quit in 2006. He smoked for over 15 years. He drinks very seldom. He works as a LawyerCNA. Lives with his girlfriend and there are kids.   PAST SURGICAL HISTORY: The patient denies having any surgeries in the past.   MEDICATIONS: 1. Diltiazem CD 240 mg once daily. 2. Aspirin 81 mg daily. 3. Coumadin 5 mg daily; prior to that was 12.5.  4. Glyburide 5 mg twice daily. 5. Lipitor 20 mg daily. 6. Metoprolol 100 mg twice daily. 7. Norvasc 5 mg daily. 8. Novolin 70/30 injection 20 units once a day.   PHYSICAL EXAMINATION:   VITAL SIGNS: Blood pressure 114/54. Pulse is actually ranging about 124 and 150. His temperature is 97.6. His pulse oximetry is 96%. Respiratory rate is about 18.   GENERAL: The patient is alert and oriented x3 in no acute respiratory distress, hemodynamically stable.   HEENT: Pupils equal and  reactive. Extraocular movements are intact. Mucosa moist. Anicteric sclerae. Conjunctivae are pink. No nasal lesions or exudates. No oropharyngeal exudates.   NECK: Supple but obese. No JVD. No thyromegaly. No adenopathy. Trachea is central.   CARDIOVASCULAR: Irregularly irregular, tachycardic. No murmurs are appreciated. No rubs.   LUNGS: Positive crackles in bilateral bases. There is no wheezing or rales. Good air entrance along lung fields.   ABDOMEN: Soft, nontender, nondistended. No hepatosplenomegaly. No masses. Bowel sounds are positive. No rebound. No guarding.   GENITAL: Negative for external lesions.   EXTREMITIES: No cyanosis. No clubbing. Positive lower extremity edema +3. Denna HaggardHomans is negative.   MUSCULOSKELETAL: Negative for joint swelling, effusions, or erythema. No major deformity. Back seems to be aligned without any point tenderness.   NEUROLOGIC: Cranial nerves II through XII are intact. His strength is 5 out of 5 in all four extremities. Sensation is maintained in four extremities. No ataxia.   PSYCH: Mood is normal without any signs of depression.  LYMPHATIC: Negative for lymphadenopathy on neck, epitrochlear, supraclavicular, axillary.   SKIN: No significant petechiae or rash. There is a little bit of bruising in the  upper part of the stomach in a couple of spots and this is from patient giving himself insulin.   LABORATORY, DIAGNOSTIC, AND RADIOLOGICAL DATA: Laboratory data is obtained from [facility]. His INR is 2.6. His hemoglobin is 15.4, white count 6.9, platelets 263, sodium 136, potassium 3.4, glucose 130.  EKG atrial fibrillation with rapid RVR. No ST depression. There are multiple PVCs.   Chest x-ray at [facility] showed increased vascular margins. No infiltrates.   ASSESSMENT AND PLAN:  1. CHF, acute exacerbation. The patient is admitted with a chief complaint of shortness of breath. The patient has history of diastolic CHF. In the  past he has been on Lasix. In the past his ejection fraction in 2011 was 55%. We are going to go ahead and put in telemetry room, weight checks. Provide oxygen to keep oxygen sats above 92. Lasix IV 40 mg every eight hours x3. After those three doses are completed, the rounding physician will evaluate if the patient requires more IV versus p.o. Lasix. The patient is currently on a beta-blocker that we are going to continue at this moment with metoprolol 100 mg twice a day. We are going to get some cardiac enzymes to rule out any cardiovascular process. I think that CHF might have been exacerbated by the acute onset of the RVR of the atrial fibrillation, although we cannot exclude that it happened the other way that the CHF actually exacerbated his atrial fibrillation for what we are going to just try to rule out that he had any coronary events.  2. Atrial fibrillation with RVR. The patient is admitted to telemetry. At this moment we are going to give one push of Cardizem. If there is no reverse or anything happening to the heart rate we are going to try to control his rate with a Cardizem drip. If that's not sufficient, we can change it to amiodarone. His INR is therapeutic at 2.6. We are going to continue Coumadin. Continue p.o. Cardizem. Next dose in the morning. Continue beta-blocker.  3. Diabetes. The patient has history of insulin dependent diabetes with Novolin 20 units given with supper. We are going to continue same doses and add insulin sliding scale.  4. Dyslipidemia. Continue Lipitor.  5. Hypokalemia. Replace and add on potassium for every time the patient gets any Lasix.  6. History of stroke. The patient currently is on aspirin and Coumadin.  7. Other medical problems seem to be stable.  Case reviewed with the patient and the nurse.   TIME SPENT ON THIS ADMISSION: 50 minutes.   ____________________________ Felipa Furnace, MD rsg:drc D: 11/13/2011 20:16:35 ET T: 11/14/2011  06:12:18 ET JOB#: [id number]  cc: Felipa Furnace, MD, <Dictator> Teena Irani. Terance Hart, MD Regan Rakers Juanda Chance MD ELECTRONICALLY SIGNED 11/17/2011 13:39

## 2014-07-14 NOTE — H&P (Signed)
PATIENT NAME:  Ralph Meadows, Ralph Meadows MR#:  [id number] DATE OF BIRTH:  [birth year]  DATE OF ADMISSION:  02/17/2012  PRIMARY CARE PHYSICIAN: Dr. Terance Hart.   CHIEF COMPLAINT:  Speech problem and numbness.   HISTORY OF PRESENT ILLNESS: This is a 54 year old male patient with past medical history of congestive heart failure, hypertension, diabetes, hypertension, diabetes, cerebrovascular accident. He was in his usual state of health until last night when he went to bed. Today morning when he woke up he noticed that his speech is not good. He tried to speak to his wife on the phone but his speech was not clear and he also felt some numbness in his right forearm. This problem continued for 15 to 20 minutes. He had clear thoughts of his expression, but only problem was to speak it out. His speech problem resolved spontaneously after 15 to 20 minutes, but his numbness in his arm which he had for the last few days on and off getting resolved by itself came today with this speech problem, but is not resolved still. He denies any headache, any dizziness, vertigo, hearing or vision problem. He denies any weakness in his arm or leg. He denies any seizures or tinnitus in the ears.   REVIEW OF SYSTEMS: Negative for fever, fatigue, weakness, pain, weight loss. EYES: No blurred or double lesion. No redness. ENT: Denies tinnitus, ear pain or discharge. RESPIRATORY: Denies cough, wheezing, hemoptysis, and dyspnea. CARDIOVASCULAR: Denies chest pain, orthopnea, edema, palpitations. GASTROINTESTINAL: Denies nausea, vomiting, diarrhea, and abdominal pain. GENITOURINARY: Denies dysuria, hematuria, and increased frequency of urination. ENDOCRINE: Decreased polyuria, nocturia. MUSCULOSKELETAL: Denies swelling or pain in the joints. NEUROLOGICAL: Has numbness in the right forearm, but denies any weakness or tremor or seizures. PSYCHIATRIC: Denies any anxiety, insomnia or depression.   PAST MEDICAL HISTORY:  1. Chronic atrial  fibrillation.  2. Benign hypertension. 3. Type 2 diabetes mellitus, insulin-dependent. 4. Dyslipidemia. 5. History of cerebrovascular accident. 6. Congestive heart failure.  ALLERGIES: Allergic to latex.   FAMILY HISTORY: Positive for stroke in him and his brother. History of sudden death in another brother who died from cardiovascular arrest. Denies any cancer history. Diabetes and hypertension running in the family.   SOCIAL HISTORY: He used to smoke 5 to 6 cigarettes a day but quit in 2006. He smoked for 15 years. He drinks very little socially. Works as Lawyer. 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   PAST SURGICAL HISTORY: Denies having any surgeries in the past.  HOME MEDICATION:  1. Diltiazem 240 mg daily. 2. Coumadin 5 mg daily except Monday and Friday take 7.5. 3. Novolin 70/30, ten units 2 times a day.  4. Carvedilol 6.25 mg b.i.d.    PHYSICAL EXAMINATION:  VITAL SIGNS: Temperature 97.9, pulse rate 58, respirations 18, blood pressure 118/80, pulse oximetry 100 on room air.   GENERAL: Fully alert, oriented to time, place and person without any acute distress and cooperative with physical examination and history taking.   HEAD AND NECK: Atraumatic. Sclerae anicteric. Conjunctivae pink. Oral mucosa moist.   NECK: Supple. No JVD. Trachea midline. Hearing is intact. Cranial nerves grossly intact.   RESPIRATORY: Bilateral clear and equal air entry. No crackles or rhonchi heard.   CARDIOVASCULAR: S1, S2 present, regular. No murmur. No JVD and no edema on the legs.   ABDOMEN: Soft, nontender. Bowel sounds present. No organomegaly felt.   EXTREMITIES: No edema. No joint tenderness or swelling.   SKIN: No rashes.   NEUROLOGICAL: Cranial nerves grossly intact.  Numbness, mild sensory deficit on the right forearm. Power five out of five in all four limbs. Coordination grossly intact. No tremor. Follows commands.   PSYCHIATRIC: Fully alert, oriented to time, place and  person.  LABORATORY, RADIOLOGICAL AND DIAGNOSTIC DATA: Glucose 293, BNP 601, BUN 12, creatinine 1.05, sodium 134, potassium 3.8, chloride 101, CO2 28, VLDL 33. LDL 87, calcium 8.9, cholesterol 172, triglycerides 167, HDL 52. Hemoglobin A1c 14.5, total protein 7.9, albumin 3.4, bilirubin 0.5, alkaline phosphatase 78, SGOT 18, SGPT 21. Digoxin level is 0.87. Troponin less than 0.02. WBC 7, hemoglobin 15.6, platelet count 223, MCV 91. Urinalysis grossly negative. CT of the head done but not reported yet. Echocardiogram from the previous admission shows ejection fraction 35%.   ASSESSMENT AND PLAN:  1. Transient ischemic attack, possible cerebrovascular accident. Observe on telemetry floor. We will get carotid Doppler, echocardiogram and MRI of brain done. I ordered Simvastatin as he was not taking any lipid-lowering medication at home. INR is 1.9 which is not exactly acceptable, so I increased his Coumadin from 5 to 7.5 mg daily. He is currently not much symptomatic except for mild numbness in his right forearm. Will watch for improvement and continued him on neurochecks. Speech is totally normal and not any facial weakness. We will start on low cholesterol and low salt diet.  2. Congestive heart failure, currently not in any active symptoms of congestive heart failure exacerbation. We will continue his home medication.  3. Hypertension. We will continue his home medication.  4. Diabetes. Continued him on Novolin Insulin.   5. Heparin subcutaneous for deep vein thrombosis prophylaxis.  6. Pantoprazole orally for gastrointestinal prophylaxis.  7. CODE STATUS: FULL CODE.   TOTAL TIME SPENT IN EVALUATION OF THE condition, PREVIOUS RESULTS, CHART REVIEW, HISTORY TAKING, PHYSICAL EXAMINATION AND EXPLAINING THE PLAN TO HIM: 50 minutes.  ____________________________ Hope PigeonVaibhavkumar G. Elisabeth PigeonVachhani, MD vgv:ap D: 02/17/2012 22:50:32 ET             T: 02/18/2012 10:21:28 ET                JOB#: [id number] cc: Hope PigeonVaibhavkumar  G. Elisabeth PigeonVachhani, MD, <Dictator> Teena Iraniavid M. Terance HartBronstein, MD Altamese DillingVAIBHAVKUMAR Rai Severns MD ELECTRONICALLY SIGNED 03/11/2012 22:44

## 2014-07-18 NOTE — H&P (Signed)
PATIENT NAME:  Ralph Meadows, Ralph Meadows MR#:  [id number] DATE OF BIRTH:  09-30-1960  DATE OF ADMISSION:  10/11/2013  PRIMARY CARE PHYSICIAN: Dr. Dorothey Baseman.  CARDIOLOGIST: Dr. Ann Maki.   CHIEF COMPLAINT: Shortness of breath and dizziness.   HISTORY OF PRESENT ILLNESS: This is a 54 year old male, who presents to the hospital today due to shortness of breath and dizziness that began at work earlier today. 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 He was feeling short of breath and dizzy, and, therefore, was brought to the Emergency Room. As per the family, the patient has been feeling fairly well, but had been complaining of some occasional shortness of breath. There was no chest pain, no nausea, no vomiting, no abdominal pain. The patient presented to the ER and was noted to be slightly hypoxic. He was also noted to be severely hypotensive with blood pressures in the 80s to 90s. He was given some IV fluids. He was sent over for a CT scan of his chest, abdomen and pelvis, and after the CT scan, the patient developed further bradycardia and was noted to be in pulseless electrical activity arrest. The patient was therefore intubated and was given atropine and epinephrine. He presently has a pulse and a blood pressure, but remains critically ill. Hospitalist services were contacted for further treatment and evaluation. As per the family, the patient had denied any acute symptoms of chest pain, any dizziness, any palpitations prior to coming to the hospital.   REVIEW OF SYSTEMS: Otherwise unobtainable given the fact that the patient is intubated and sedated.   PAST MEDICAL HISTORY: Consistent with a history of chronic atrial fibrillation, cardiomyopathy, ejection fraction of 35%, diabetes, morbid obesity, hypertension.   ALLERGIES: The patient has no known drug allergies.   SOCIAL HISTORY: Used to smoke, does have a 30-pack-year smoking history, quit many years ago. Also drinks occasionally.  No illicit drug abuse. Lives with his girlfriend.   FAMILY HISTORY: Mother is alive, is also a diabetic, has atrial fibrillation and hypertension. Father died from complications of liver cirrhosis.   CURRENT MEDICATIONS: Coreg 6.25 mg b.i.d., digoxin 0.25 mg daily, Lasix 40 mg daily, insulin novolog 70/30 28 units b.i.d., potassium 20 mEq daily. Warfarin 5 mg on Tuesday, Wednesday, Thursday, Saturday and Sunday; takes 7.5 mg on Monday and Friday.   PHYSICAL EXAMINATION:  VITAL SIGNS: Temperature 97.6, pulse 42, respirations 18, blood pressure 85/37, saturations are presently 90% on 100% ventilator.  GENERAL: He is a critically ill-appearing patient who is sedated and intubated.  HEENT: He is atraumatic, normocephalic. His pupils are reactive to light. His sclerae are anicteric. No conjunctival injection. No pharyngeal erythema.  NECK: Supple. There is no jugular venous distention. No bruits, no lymphadenopathy, no thyromegaly.   HEART: Distant, regular. No murmurs, rubs or clicks.  LUNGS: Clear to auscultation anteriorly. No rales, rhonchi, no wheezes. ABDOMEN: Soft, flat, nontender, and nondistended. Hypoactive bowel sounds. No hepatosplenomegaly appreciated.  EXTREMITIES: No evidence of any cyanosis, clubbing, or peripheral edema. Has faint pedal, but +2 radial pulses bilaterally.  NEUROLOGICAL: The patient is currently sedated and intubated. Difficult to do a full neurological examination.  SKIN: Moist and warm with no rashes.  LYMPHATIC: There is no cervical or axillary adenopathy.   LABORATORY DATA: Serum glucose of 217, BUN 11, creatinine 1.5, sodium 136, potassium 4.8, chloride 105, bicarbonate 22. The patient's LFTs are within normal limits. Troponin less than 0.02. White cell count is 6.8, hemoglobin 14.9, hematocrit 46.7, platelet count 245,000.  Digoxin level is 1.45, INR is 1.4.   Urinalysis within normal limits.   ABG showed a pH of 7.27, pCO2 of 30, pO2 of 82, saturations 97% on  100% FiO2.   ASSESSMENT AND PLAN: This is a 54 year old male with a history of diabetes, morbid obesity, cardiomyopathy, ejection fraction of 35%, chronic atrial fibrillation, who presents to the hospital due to shortness of breath and dizziness. The patient then became hypotensive and went into pulseless electrical activity cardiac arrest.  1. Shock. The exact etiology of the shock is unclear, whether it is a combination of hypovolemic and cardiogenic shock. I do not suspect septic shock as the patient is afebrile and has no acute source of infection. His chest x-ray and urinalysis are negative. For now, I will aggressively hydrate the patient with IV fluids and give IV vasopressors with Levophed. Follow hemodynamics, keep mean arterial pressures greater than 60. We will check a 2-dimensional echocardiogram to assess his ejection fraction, the last one being 35%. If it is worse, consider a dobutamine drip.  2. Acute renal failure. This is likely acute tubular necrosis from the shock. I will continue aggressive hydration with IV fluids, continue on vasopressors, follow BUN and creatinine and urine output, renal dose medications, avoid nephrotoxins.  3. Acute respiratory failure. The patient was intubated as he went into pulseless electrical activity arrest. CT chest is negative for pulmonary embolus. Chest x-ray negative for pneumonia, maybe some mild congestive heart failure. Continue supportive care on the ventilator. Wean FiO2 as tolerated. We will get a pulmonary consult for ventilator management.  4. History of chronic atrial fibrillation. The patient is currently rate controlled, more on the bradycardic side. Hold the Coreg and digoxin. The patient is on Coumadin, but INR is subtherapeutic. Continue Lovenox for now.  5. Diabetes. Continue sliding scale insulin.  6. Metabolic acidosis. This is likely lactic acidosis from the hypotension. I will continue IV fluids and IV vasopressors. Follow serial  arterial blood gases.   The patient is a Full Code.   CRITICAL CARE TIME SPENT: 60 minutes.    ____________________________ Rolly PancakeVivek J. Cherlynn KaiserSainani, MD vjs:jr D: 10/11/2013 16:17:50 ET T: 10/11/2013 16:30:02 ET JOB#: [id number]  cc: Rolly PancakeVivek J. Cherlynn KaiserSainani, MD, <Dictator> Houston SirenVIVEK J SAINANI MD ELECTRONICALLY SIGNED 10/23/2013 14:10

## 2014-07-18 NOTE — Discharge Summary (Signed)
PATIENT NAME:  Ralph Meadows, Ralph Meadows MR#:  [id number] DATE OF BIRTH:  03/18/1961  DATE OF ADMISSION:  10/11/2013 DATE OF DISCHARGE:  10/21/2013  This is a final discharge summary in addition , basically as an addendum to the interim discharge summary dictate by  Dr. Cherlynn KaiserSainani on July 26.    This dictation covers July 27 and 28 hospital stay.   Overall, Ralph Meadows is doing better. Mentation is improved at baseline. He is still weak and physical therapy recommends rehabilitation. He will be discharged to [facility] once insurance authorization is approved. The patient was started on p.o. Eliquis as part of his anticoagulation for chronic atrial fibrillation. This was discussed with Dr. Lady GaryFath. Overall, his hospital stay was prolonged; however, he improved. His CODE STATUS IS FULL CODE.   DISCHARGE INSTRUCTIONS:  1.  Two grams sodium, ADA 1800 calorie diet.  2.  Follow up with [facility] Darrold [facility]Paraschos [facility] after discharge from rehabilitation. 3.  Follow up with Dr. Terance HartBronstein, primary care physician, in two weeks.  4.  Physical therapy.   MEDICATIONS AT DISCHARGE:  1.  Tylenol 650 q. 4 p.r.n.  2.  Sliding scale insulin.  3.  Docusate 100 mg b.i.d. p.r.n.  4.  Lactulose 30 mL b.i.d. p.r.n.  5.  Ibuprofen 400 mg b.i.d. p.r.n.  6.  Senokot 10 mL t.i.d. p.r.n.  7.  Provigil 100 mg p.o. daily. Stop last dose on 10/22/2013.  8.  Zestril 5 mg daily.  9.  Amiodarone 200 mg b.i.d.  10.  Carvedilol 6.25 b.i.d.  11.  Lasix 40 mg daily.  12.  Digoxin 0.25 mg daily.  13.  Cefuroxime 500 mg b.i.d.  14.  Eliquis 5 mg b.i.d.  15.  K-Dur 20 mEq p.o. daily.  16.  Insulin 70/30 twenty units before breakfast. Please adjust insulin dosage according to the patient sugar and increase accordingly. His home dose of insulin 70/30 was 28 units b.i.d. just for information.   TIME SPENT: 40 minutes.   The discharge plan was discussed with patient and daughter.   ____________________________ Wylie HailSona A. Allena KatzPatel,  MD sap:ts D: 10/21/2013 11:03:03 ET T: 10/21/2013 11:42:23 ET JOB#: [id number]  cc: Karee Forge A. Allena KatzPatel, MD, <Dictator> Marcina MillardAlexander Paraschos, MD Teena Iraniavid M. Terance HartBronstein, MD Willow OraSONA A Tyshauna Finkbiner MD ELECTRONICALLY SIGNED 11/05/2013 11:33

## 2014-07-18 NOTE — Consult Note (Signed)
Physical Exam:  GEN obese, critically ill appearing   HEENT pale conjunctivae   NECK No masses   RESP breathing with vent   CARD Irregular rate and rhythm  Bradycardic   ABD no liver/spleen enlargement  morbid obesity   LYMPH negative neck, negative axillae   EXTR negative cyanosis/clubbing, negative edema   SKIN No rashes   PSYCH sedated   Review of Systems:  Subjective/Chief Complaint s/p arrest on vent.   ROS Pt not able to provide ROS   Medications/Allergies Reviewed Medications/Allergies reviewed   EKG:  Interpretation afib with slow vr    Latex: Other   Impression Morbidly obese male with history of afib and cardiomyopathy with reported ef of 25% who preented to the er with complaints of weakness and shortness of breath. He was noted to be in slow afib. He was on digoxin and carvedilol for his afib. He works as a cna. He reported he felt weak and dizzy. On arrival ini the er his heart rate was in the 40's and he was sent for a chest ct. While there, he became more hypostensive and bradycardic and became pulseless. Was felt to be in PEA. He was intobated and currently is on levophed with sbp of 90. Heart rate is 40s-50-s with slow afib. Brief echo revealed ef of probably 20% with mild mr. Chest ct revealed small pericardical effusion. trivial if any effusion on echo with no tamponde.   Plan 1. Continue levophed with consideration of changeing to dopamine. 2. Add dobutamine at low dose and titrate to improve forward flow. 3. Careful use of iv fluids given cardiomyopahty 4. Rule out for mi 5. Tech echo asap 6. Hold rate reated meds including digoxin and carvedilol   Electronic Signatures: Dalia HeadingFath, Stepanie Graver A (MD)  (Signed 18-Jul-15 17:25)  Authored: History and Physical Exam, Review of System, EKG , Allergies, Impression/Plan   Last Updated: 18-Jul-15 17:25 by Dalia HeadingFath, Oletha Tolson A (MD)

## 2014-07-18 NOTE — Consult Note (Signed)
PATIENT NAME:  Ralph Meadows, Ralph Meadows MR#:  [id number] DATE OF BIRTH:  February 05, 1961  DATE OF CONSULTATION:  10/12/2013  REFERRING PHYSICIAN:  Vivek J. Cherlynn KaiserSainani, MD.  CONSULTING PHYSICIAN:  Weston SettleShervin Jamel Holzmann, MD.  REASON FOR CONSULTATION: Questionable stroke.   HISTORY OF PRESENT ILLNESS: The patient is a 54 year old African American male who was admitted on July 18 after he started developing some lightheadedness and shortness of breath and dizziness.  While in the Emergency Room the patient was sent for a CT scan of the chest to rule out pulmonary embolism, which was negative. Shortly after the CT he developed pulseless electrical activity and severe hypotension with systolics in the 70s and, requiring intubation and cardiac resuscitation.  He then was placed in the CT scanner and a CT of the brain was performed which I reviewed personally. It indicates no acute hemorrhages. There is evidence of an old left parietal infarct, an old right anterior temporal infarct and bilateral cerebellar old cerebellar infarcts, bigger on the left.  In retrospect an MRI of the brain done in November 2013 also shows all strokes as well, so none of these are new. The patient does have a history of atrial fibrillation and cardiomyopathy with ejection fraction of 35%. He was on Coumadin at home when he came in, however, his INR was subtherapeutic at 1.4. Urinalysis and chest x-ray have been normal. CBC is normal, thyroid function is normal, troponin I has been negative x 3. CMP has been negative except for mildly elevated creatinine of 1.58 with a GFR of 50.   PAST MEDICAL HISTORY:  1.  Chronic atrial fibrillation.  2.  Cardiomyopathy.   PAST SURGICAL HISTORY: Negative.   CURRENT MEDICATIONS IN THE HOSPITAL:  1.  Lovenox 40 mg subcutaneously q.12 hours.  2.  Insulin sliding scale.  3.  Aspirin 300 mg per rectum daily.  4.  Propofol drip 20 mcg/kg per minute.  5.  Dobutamine drip 2.5 mcg/kg per minute.   ALLERGIES: No known  drug allergies.   SOCIAL HISTORY: Unobtainable.   FAMILY HISTORY: Unobtainable.   REVIEW OF SYSTEMS: Unobtainable.   PHYSICAL EXAMINATION:  VITAL SIGNS:  His blood pressure is 116/87, pulse of 97.  Initially upon arrival, his pulse was in the 40s and he was on Coreg and digoxin, which have been stopped. His temperature is currently 100.7, but his T-max was 101.8. No signs of infection have been found and this is likely to be central in origin. The patient is intubated and unresponsive. He was over-ventilated with the latest ABG being a pH of 7.51, pCO2 24, pO2 179, bicarbonate 19 on 60% FiO2. The patient opens his eyes slightly to painful stimulation. His pupils are equal and reactive. Corneal reflexes are present bilaterally. Oculocephalic reflex is present, the gag reflex is fully intact, he does withdraw to pain in all 4 extremities, slightly better on the left upper extremity versus the right. He does localize to pain with all 4 extremities. There is no Babinski sign. There is no Hoffmann sign. Sensory, coordination and gait could not be performed.   IMPRESSION AND PLAN: This is a patient with chronic atrial fibrillation on rate-controlling medications at home including digoxin and Coreg which caused a significant amount of bradycardia which lead to his lightheadedness, dizziness and shortness of breath. 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 I think the degree of injury is very minimal, however, since  he apparently was resuscitated very quickly in the Emergency Room. The ischemic infarcts that are visible on CT scan are all old in nature and they are not new related to this admission. They are all likely to be cardioembolic strokes in the past.   The patient should be anticoagulated given his history of chronic atrial fibrillation and recurrent stroke risk. He is currently on 40 mg  of Lovenox every 12 hours. This should be titrated to 1 mg/kg q.12 hours. I think this is probably a lower dose than what his weight suggests at this time.   The patient should have an EEG tomorrow to assess his cortical function and this should be done off all sedation including propofol drip to see if there is any underlying anoxic brain injury at this time.   The patient should eventually be transitioned back to Coumadin and if there is any difficulty maintaining his INR therapeutic between 2 and 3, then consideration should be given to switching him to Xarelto or Eliquis, which might be easier for him and improve his compliance.    ____________________________ Weston Settle, MD se:lt D: 10/12/2013 13:11:59 ET T: 10/12/2013 15:17:11 ET JOB#: [id number]  cc: Weston Settle, MD, <Dictator> Weston Settle MD ELECTRONICALLY SIGNED 11/15/2013 11:06

## 2014-10-21 ENCOUNTER — Emergency Department
Admission: EM | Admit: 2014-10-21 | Discharge: 2014-10-21 | Disposition: A | Discharge status: Home / Self Care | Payer: BLUE CROSS/BLUE SHIELD | Attending: Emergency Medicine | Admitting: Emergency Medicine

## 2014-10-21 ENCOUNTER — Encounter: Payer: Self-pay | Admitting: Emergency Medicine

## 2014-10-21 ENCOUNTER — Other Ambulatory Visit: Payer: Self-pay

## 2014-10-21 DIAGNOSIS — R42 Dizziness and giddiness: Secondary | ICD-10-CM | POA: Insufficient documentation

## 2014-10-21 DIAGNOSIS — Z91040 Latex allergy status: Secondary | ICD-10-CM | POA: Diagnosis not present

## 2014-10-21 DIAGNOSIS — R4182 Altered mental status, unspecified: Secondary | ICD-10-CM | POA: Diagnosis present

## 2014-10-21 DIAGNOSIS — E119 Type 2 diabetes mellitus without complications: Secondary | ICD-10-CM | POA: Insufficient documentation

## 2014-10-21 DIAGNOSIS — I10 Essential (primary) hypertension: Secondary | ICD-10-CM | POA: Insufficient documentation

## 2014-10-21 DIAGNOSIS — Z794 Long term (current) use of insulin: Secondary | ICD-10-CM | POA: Insufficient documentation

## 2014-10-21 DIAGNOSIS — Z87891 Personal history of nicotine dependence: Secondary | ICD-10-CM | POA: Diagnosis not present

## 2014-10-21 DIAGNOSIS — R350 Frequency of micturition: Secondary | ICD-10-CM | POA: Insufficient documentation

## 2014-10-21 DIAGNOSIS — Z7901 Long term (current) use of anticoagulants: Secondary | ICD-10-CM | POA: Insufficient documentation

## 2014-10-21 DIAGNOSIS — Z79899 Other long term (current) drug therapy: Secondary | ICD-10-CM | POA: Diagnosis not present

## 2014-10-21 HISTORY — DX: Atherosclerotic heart disease of native coronary artery without angina pectoris: I25.10

## 2014-10-21 HISTORY — DX: Type 2 diabetes mellitus without complications: E11.9

## 2014-10-21 HISTORY — DX: Essential (primary) hypertension: I10

## 2014-10-21 HISTORY — DX: Heart failure, unspecified: I50.9

## 2014-10-21 LAB — CBC
HEMATOCRIT: 42 % (ref 40.0–52.0)
HEMOGLOBIN: 14.2 g/dL (ref 13.0–18.0)
MCH: 31.7 pg (ref 26.0–34.0)
MCHC: 33.7 g/dL (ref 32.0–36.0)
MCV: 93.9 fL (ref 80.0–100.0)
PLATELETS: 212 10*3/uL (ref 150–440)
RBC: 4.47 MIL/uL (ref 4.40–5.90)
RDW: 14.2 % (ref 11.5–14.5)
WBC: 6.1 10*3/uL (ref 3.8–10.6)

## 2014-10-21 LAB — URINALYSIS COMPLETE WITH MICROSCOPIC (ARMC ONLY)
Bacteria, UA: NONE SEEN
Bilirubin Urine: NEGATIVE
GLUCOSE, UA: NEGATIVE mg/dL
Hgb urine dipstick: NEGATIVE
Ketones, ur: NEGATIVE mg/dL
Leukocytes, UA: NEGATIVE
Nitrite: NEGATIVE
Protein, ur: NEGATIVE mg/dL
SPECIFIC GRAVITY, URINE: 1.006 (ref 1.005–1.030)
SQUAMOUS EPITHELIAL / LPF: NONE SEEN
pH: 6 (ref 5.0–8.0)

## 2014-10-21 LAB — COMPREHENSIVE METABOLIC PANEL
ALT: 19 U/L (ref 17–63)
AST: 24 U/L (ref 15–41)
Albumin: 4 g/dL (ref 3.5–5.0)
Alkaline Phosphatase: 54 U/L (ref 38–126)
Anion gap: 9 (ref 5–15)
BILIRUBIN TOTAL: 0.5 mg/dL (ref 0.3–1.2)
BUN: 12 mg/dL (ref 6–20)
CO2: 27 mmol/L (ref 22–32)
Calcium: 9.2 mg/dL (ref 8.9–10.3)
Chloride: 101 mmol/L (ref 101–111)
Creatinine, Ser: 1.23 mg/dL (ref 0.61–1.24)
GFR calc Af Amer: >60 mL/min (ref >60)
GFR calc non Af Amer: >60 mL/min (ref >60)
Glucose, Bld: 140 mg/dL — ABNORMAL HIGH (ref 65–99)
Potassium: 4.3 mmol/L (ref 3.5–5.1)
Sodium: 137 mmol/L (ref 135–145)
TOTAL PROTEIN: 8 g/dL (ref 6.5–8.1)

## 2014-10-21 LAB — PROTIME-INR
INR: 2.27
Prothrombin Time: 25.2 seconds — ABNORMAL HIGH (ref 11.4–15.0)

## 2014-10-21 LAB — DIGOXIN LEVEL: Digoxin Level: 0.5 ng/mL — ABNORMAL LOW (ref 0.8–2.0)

## 2014-10-21 LAB — GLUCOSE, CAPILLARY: Glucose-Capillary: 122 mg/dL — ABNORMAL HIGH (ref 65–99)

## 2014-10-21 LAB — TROPONIN I: Troponin I: <0.03 ng/mL (ref <0.031)

## 2014-10-21 NOTE — Discharge Instructions (Signed)
You were evaluated for an episode of lightheadedness, and no certain cause was found, however your exam and evaluation are reassuring today. Your digoxin level was found to be slightly low, go ahead and go back up to the pill. You need to follow up with your primary care doctor in 1-2 days. Return to the emergency department for any new or worsening condition including headache, confusion, weakness, numbness, phonation problems, slurred speech or trouble finding words, or any other symptoms concerning to you such as chest pain or trouble breathing or fever or episode passing out.   Dizziness Dizziness is a common problem. It is a feeling of unsteadiness or light-headedness. You may feel like you are about to faint. Dizziness can lead to injury if you stumble or fall. A person of any age group can suffer from dizziness, but dizziness is more common in older adults. CAUSES  Dizziness can be caused by many different things, including:  Middle ear problems.  Standing for too long.  Infections.  An allergic reaction.  Aging.  An emotional response to something, such as the sight of blood.  Side effects of medicines.  Tiredness.  Problems with circulation or blood pressure.  Excessive use of alcohol or medicines, or illegal drug use.  Breathing too fast (hyperventilation).  An irregular heart rhythm (arrhythmia).  A low red blood cell count (anemia).  Pregnancy.  Vomiting, diarrhea, fever, or other illnesses that cause body fluid loss (dehydration).  Diseases or conditions such as Parkinson's disease, high blood pressure (hypertension), diabetes, and thyroid problems.  Exposure to extreme heat. DIAGNOSIS  Your health care provider will ask about your symptoms, perform a physical exam, and perform an electrocardiogram (ECG) to record the electrical activity of your heart. Your health care provider may also perform other heart or blood tests to determine the cause of your dizziness.  These may include:  Transthoracic echocardiogram (TTE). During echocardiography, sound waves are used to evaluate how blood flows through your heart.  Transesophageal echocardiogram (TEE).  Cardiac monitoring. This allows your health care provider to monitor your heart rate and rhythm in real time.  Holter monitor. This is a portable device that records your heartbeat and can help diagnose heart arrhythmias. It allows your health care provider to track your heart activity for several days if needed.  Stress tests by exercise or by giving medicine that makes the heart beat faster. TREATMENT  Treatment of dizziness depends on the cause of your symptoms and can vary greatly. HOME CARE INSTRUCTIONS   Drink enough fluids to keep your urine clear or pale yellow. This is especially important in very hot weather. In older adults, it is also important in cold weather.  Take your medicine exactly as directed if your dizziness is caused by medicines. When taking blood pressure medicines, it is especially important to get up slowly.  Rise slowly from chairs and steady yourself until you feel okay.  In the morning, first sit up on the side of the bed. When you feel okay, stand slowly while holding onto something until you know your balance is fine.  Move your legs often if you need to stand in one place for a long time. Tighten and relax your muscles in your legs while standing.  Have someone stay with you for 1-2 days if dizziness continues to be a problem. Do this until you feel you are well enough to stay alone. Have the person call your health care provider if he or she notices changes in  you that are concerning.  Do not drive or use heavy machinery if you feel dizzy.  Do not drink alcohol. SEEK IMMEDIATE MEDICAL CARE IF:   Your dizziness or light-headedness gets worse.  You feel nauseous or vomit.  You have problems talking, walking, or using your arms, hands, or legs.  You feel  weak.  You are not thinking clearly or you have trouble forming sentences. It may take a friend or family member to notice this.  You have chest pain, abdominal pain, shortness of breath, or sweating.  Your vision changes.  You notice any bleeding.  You have side effects from medicine that seems to be getting worse rather than better. MAKE SURE YOU:   Understand these instructions.  Will watch your condition.  Will get help right away if you are not doing well or get worse. Document Released: 09/06/2000 Document Revised: 03/18/2013 Document Reviewed: 09/30/2010 [facility] Patient Information 2015 [place], [place]. This information is not intended to replace advice given to you by your health care provider. Make sure you discuss any questions you have with your health care provider.

## 2014-10-21 NOTE — ED Notes (Signed)
Confused , nausea, dizziness started approx 1 hour PTA, pt A&Ox4 , no distress noted , has had similar episode last July and was admitted.

## 2014-10-21 NOTE — ED Provider Notes (Signed)
HISTORY  Chief Complaint Altered Mental Status   Historian Patient  [name] is a 54 y.o. 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 There is no room spinning. There is no chest pain or shortness of breath. He felt like he "spaced out "for a few minutes but he is not having any confusion or mental status problems now. He says he just still feels very fatigued. He says he might just be really anxious because of what happened last year. Last year he states he had similar symptoms and things went "from bad to worse "and he was admitted at that time.    Past Medical History  Diagnosis Date  . Coronary artery disease   . Diabetes mellitus without complication   . Hypertension   . CHF (congestive heart failure)     There are no active problems to display for this patient.   History reviewed. No pertinent past surgical history.  Current Outpatient Rx  Name  Route  Sig  Dispense  Refill  . amiodarone (PACERONE) 200 MG tablet   Oral   Take 200 mg by mouth 2 (two) times daily.         . carvedilol (COREG) 3.125 MG tablet   Oral   Take 1.562 mg by mouth 2 (two) times daily with a meal.         . digoxin (LANOXIN) 0.25 MG tablet   Oral   Take 0.125 mg by mouth daily. Patient is taking differently than prescribed, only takes half a tablet         . furosemide (LASIX) 40 MG tablet   Oral   Take 40 mg by mouth daily.         . insulin NPH-regular Human (NOVOLIN 70/30) (70-30) 100 UNIT/ML injection   Subcutaneous   Inject 24 Units into the skin every morning.         . insulin NPH-regular Human (NOVOLIN 70/30) (70-30) 100 UNIT/ML injection   Subcutaneous   Inject 22 Units into the skin every evening.         . potassium chloride SA (K-DUR,KLOR-CON) 20 MEQ tablet   Oral   Take 20  mEq by mouth daily.         . traMADol (ULTRAM) 50 MG tablet   Oral   Take 50 mg by mouth daily as needed.         . warfarin (COUMADIN) 5 MG tablet   Oral   Take 5 mg by mouth daily. Per pharmacy, direction says take as directed by provider           Allergies Lisinopril and Latex  No family history on file.  Social History History  Substance Use Topics  . Smoking status: Former Games developer  . Smokeless tobacco: Not on file  . Alcohol Use: Not on file    Review of Systems  Constitutional: Negative for fever. Eyes: Negative for visual changes. ENT: Negative for sore throat. Cardiovascular: Negative for chest pain. Respiratory: Negative for shortness of breath. Gastrointestinal: Negative for abdominal pain, vomiting and diarrhea. Genitourinary: Positive frequency of urination, but states he takes Lasix Musculoskeletal: Negative for back pain. Skin: Negative for rash. Neurological: Negative for headaches, focal weakness or numbness. 10 point Review of Systems otherwise negative  ____________________________________________   PHYSICAL EXAM:  VITAL SIGNS: ED Triage Vitals  Enc Vitals Group     BP 10/21/14 0745 137/91 mmHg     Pulse Rate 10/21/14 0745 71     Resp 10/21/14 0745 18     Temp 10/21/14 0745 98.1 F (36.7 C)     Temp Source 10/21/14 0745 Oral     SpO2 10/21/14 0745 98 %     Weight 10/21/14 0745 265 lb (120.203 kg)     Height 10/21/14 0745 5\' 8"  (1.727 m)     Head Cir --      Peak Flow --      Pain Score 10/21/14 0746 0     Pain Loc --      Pain Edu? --      Excl. in GC? --      Constitutional: Alert and oriented. Well appearing and in no distress. Eyes: Conjunctivae are normal. PERRL. Normal extraocular movements. ENT   Head: Normocephalic and atraumatic.   Nose: No congestion/rhinnorhea.   Mouth/Throat: Mucous membranes are moist.   Neck: No stridor. Cardiovascular/Chest: Normal rate, regular rhythm.  No murmurs, rubs, or  gallops. Respiratory: Normal respiratory effort without tachypnea nor retractions. Breath sounds are clear and equal bilaterally. No wheezes/rales/rhonchi. Gastrointestinal: Soft. No distention, no guarding, no rebound. Nontender . Obese.  Genitourinary/rectal:Deferred Musculoskeletal: Nontender with normal range of motion in all extremities. No joint effusions.  No lower extremity tenderness nor edema. Neurologic:  Normal speech and language. No gross or focal neurologic deficits are appreciated. 5 out of 5 strength in 4 extremities. No focal sensory deficits. Cranial nerves II through X are intact. Skin:  Skin is warm, dry and intact. No rash noted. Psychiatric: Mood and affect are normal. Speech and behavior are normal. Patient exhibits appropriate insight and judgment.  ____________________________________________   EKG I, Governor Rooks, MD, the attending physician have personally viewed and interpreted all ECGs.  70 bpm. Normal sinus rhythm. Narrow QRS. Normal axis. Nonspecific T wave. ____________________________________________  LABS (pertinent positives/negatives)  INR 2.27 Urinalysis negative Metabolic panel without significant abnormality White blood cell count 6.1, hemoglobin 14.2 Digoxin 0.5  ____________________________________________  RADIOLOGY All Xrays were viewed by me. Imaging interpreted by Radiologist.  None __________________________________________  PROCEDURES  Procedure(s) performed: None Critical Care performed: None  ____________________________________________   ED COURSE / ASSESSMENT AND PLAN  CONSULTATIONS: None  Pertinent labs & imaging results that were available during my care of the patient were reviewed by me and considered in my medical decision making (see chart for details).  Patient is overall well-appearing with stable vital signs. I reviewed the patient's presentation from last year where he arrived with bradycardia and some  chest symptoms and after a CT scan he went into PA arrest and was resuscitated. Today's episode seems different as there are no cardiac complaints.  Patient felt better by arrival. His neurologic exam is intact. His mental status is normal. At present he is just complaining of feeling a little fatigued. He states himself that he thinks he is being overly cautious just because of what happened last year.    His exam and laboratory evaluation are reassuring. I do not see any indication for neuroimaging. Only laboratory abnormalities found was low digoxin level, and he states he was decreased to half a tablet, and to have him go back up to a full tablet. He does need to follow-up with his primary doctor or his cardiologist in 1-2 days.   Patient / Family /  Caregiver informed of clinical course, medical decision-making process, and agree with plan.   I discussed return precautions, follow-up instructions, and discharged instructions with patient and/or family.  ___________________________________________   FINAL CLINICAL IMPRESSION(S) / ED DIAGNOSES   Final diagnoses:  Lightheadedness    FOLLOW UP  Referred to:   I care physician or his cardiologist in 1-2 days.   Governor Rooks, MD 10/21/14 [phone]

## 2015-06-01 ENCOUNTER — Emergency Department
Admission: EM | Admit: 2015-06-01 | Discharge: 2015-06-01 | Disposition: A | Discharge status: Home / Self Care | Payer: BLUE CROSS/BLUE SHIELD | Attending: Emergency Medicine | Admitting: Emergency Medicine

## 2015-06-01 ENCOUNTER — Encounter: Payer: Self-pay | Admitting: Medical Oncology

## 2015-06-01 DIAGNOSIS — E11621 Type 2 diabetes mellitus with foot ulcer: Secondary | ICD-10-CM | POA: Diagnosis not present

## 2015-06-01 DIAGNOSIS — Z7901 Long term (current) use of anticoagulants: Secondary | ICD-10-CM | POA: Insufficient documentation

## 2015-06-01 DIAGNOSIS — L089 Local infection of the skin and subcutaneous tissue, unspecified: Secondary | ICD-10-CM | POA: Diagnosis present

## 2015-06-01 DIAGNOSIS — L97529 Non-pressure chronic ulcer of other part of left foot with unspecified severity: Secondary | ICD-10-CM | POA: Diagnosis not present

## 2015-06-01 DIAGNOSIS — Z794 Long term (current) use of insulin: Secondary | ICD-10-CM | POA: Diagnosis not present

## 2015-06-01 DIAGNOSIS — Z87891 Personal history of nicotine dependence: Secondary | ICD-10-CM | POA: Diagnosis not present

## 2015-06-01 DIAGNOSIS — Z91040 Latex allergy status: Secondary | ICD-10-CM | POA: Diagnosis not present

## 2015-06-01 DIAGNOSIS — I10 Essential (primary) hypertension: Secondary | ICD-10-CM | POA: Insufficient documentation

## 2015-06-01 DIAGNOSIS — Z79899 Other long term (current) drug therapy: Secondary | ICD-10-CM | POA: Diagnosis not present

## 2015-06-01 MED ORDER — BACITRACIN-NEOMYCIN-POLYMYXIN 400-5-5000 EX OINT
TOPICAL_OINTMENT | CUTANEOUS | Status: AC
  Filled 2015-06-01: qty 1

## 2015-06-01 MED ORDER — SULFAMETHOXAZOLE-TRIMETHOPRIM 800-160 MG PO TABS: 1.0000 | ORAL_TABLET | Freq: Two times a day (BID) | ORAL | Status: DC

## 2015-06-01 NOTE — ED Provider Notes (Signed)
[facility] Emergency Department Provider Note  ____________________________________________  Time seen: Approximately 11:11 AM  I have reviewed the triage vital signs and the nursing notes.   HISTORY  Chief Complaint Wound Infection    [name] is a 55 y.o. male patient complaining of pain secondary to an ulcer on the plantar aspect of the first metatarsal head left foot. Patient stated wound was healing well and he feel his antibiotics 2 weeks ago. Patient state he*walking barefooted and reopen the wound. Patient was followed by podiatry at the local clinic but has had his care transferred to the [facility]. Patient rates his pain as a 3/10. No drainage from the wound at this time.   Past Medical History  Diagnosis Date  . Coronary artery disease   . Diabetes mellitus without complication (HCC)   . Hypertension   . CHF (congestive heart failure) (HCC)     There are no active problems to display for this patient.   History reviewed. No pertinent past surgical history.  Current Outpatient Rx  Name  Route  Sig  Dispense  Refill  . amiodarone (PACERONE) 200 MG tablet   Oral   Take 200 mg by mouth 2 (two) times daily.         . carvedilol (COREG) 3.125 MG tablet   Oral   Take 1.562 mg by mouth 2 (two) times daily with a meal.         . digoxin (LANOXIN) 0.25 MG tablet   Oral   Take 0.125 mg by mouth daily. Patient is taking differently than prescribed, only takes half a tablet         . furosemide (LASIX) 40 MG tablet   Oral   Take 40 mg by mouth daily.         . insulin NPH-regular Human (NOVOLIN 70/30) (70-30) 100 UNIT/ML injection   Subcutaneous   Inject 24 Units into the skin every morning.         . insulin NPH-regular Human (NOVOLIN 70/30) (70-30) 100 UNIT/ML injection   Subcutaneous   Inject 22 Units into the skin every evening.         . potassium chloride SA (K-DUR,KLOR-CON) 20 MEQ tablet   Oral   Take 20 mEq by  mouth daily.         Marland Kitchen. sulfamethoxazole-trimethoprim (BACTRIM DS,SEPTRA DS) 800-160 MG tablet   Oral   Take 1 tablet by mouth 2 (two) times daily.   20 tablet   0   . traMADol (ULTRAM) 50 MG tablet   Oral   Take 50 mg by mouth daily as needed.         . warfarin (COUMADIN) 5 MG tablet   Oral   Take 5 mg by mouth daily. Per pharmacy, direction says take as directed by provider           Allergies Lisinopril and Latex  No family history on file.  Social History Social History  Substance Use Topics  . Smoking status: Former Games developermoker  . Smokeless tobacco: None  . Alcohol Use: None    Review of Systems Constitutional: No fever/chills Eyes: No visual changes. ENT: No sore throat. Cardiovascular: Denies chest pain. Respiratory: Denies shortness of breath. Gastrointestinal: No abdominal pain.  No nausea, no vomiting.  No diarrhea.  No constipation. Genitourinary: Negative for dysuria. Musculoskeletal: Negative for back pain. Skin: Negative for rash. Neurological: Negative for headaches, focal weakness or numbness. Endocrine:Diabetes and hypertension. Hematological/Lymphatic: Allergic/Immunilogical: Lisinopril and  latex.  10-point ROS otherwise negative.  ____________________________________________   PHYSICAL EXAM:  VITAL SIGNS: ED Triage Vitals  Enc Vitals Group     BP 06/01/15 1015 138/88 mmHg     Pulse Rate 06/01/15 1015 86     Resp 06/01/15 1015 18     Temp 06/01/15 1015 97.8 F (36.6 C)     Temp Source 06/01/15 1015 Oral     SpO2 06/01/15 1015 99 %     Weight 06/01/15 1015 260 lb (117.935 kg)     Height 06/01/15 1015  (1.727 m)     Head Cir --      Peak Flow --      Pain Score 06/01/15 1016 3     Pain Loc --      Pain Edu? --      Excl. in GC? --     Constitutional: Alert and oriented. Well appearing and in no acute distress. Eyes: Conjunctivae are normal. PERRL. EOMI. Head: Atraumatic. Nose: No congestion/rhinnorhea. Mouth/Throat:  Mucous membranes are moist.  Oropharynx non-erythematous. Neck: No stridor.  No cervical spine tenderness to palpation. Hematological/Lymphatic/Immunilogical: No cervical lymphadenopathy. Cardiovascular: Normal rate, regular rhythm. Grossly normal heart sounds.  Good peripheral circulation. Respiratory: Normal respiratory effort.  No retractions. Lungs CTAB. Gastrointestinal: Soft and nontender. No distention. No abdominal bruits. No CVA tenderness. Musculoskeletal: No lower extremity tenderness nor edema.  No joint effusions. Neurologic:  Normal speech and language. No gross focal neurologic deficits are appreciated. No gait instability. Skin:  Erythematous left foot ulcer. Psychiatric: Mood and affect are normal. Speech and behavior are normal.  ____________________________________________   LABS (all labs ordered are listed, but only abnormal results are displayed)  Labs Reviewed - No data to display ____________________________________________  EKG   ____________________________________________  RADIOLOGY   ____________________________________________   PROCEDURES  Procedure(s) performed: None  Critical Care performed: No  ____________________________________________   INITIAL IMPRESSION / ASSESSMENT AND PLAN / ED COURSE  Pertinent labs & imaging results that were available during my care of the patient were reviewed by me and considered in my medical decision making (see chart for details).  Left plantar foot ulcer. Patient given discharge care instructions. Patient is a prescription for Bactrim DS and [facility] 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

## 2015-06-01 NOTE — Discharge Instructions (Signed)
Take medication as directed.

## 2015-06-01 NOTE — ED Notes (Signed)
Pt reports that he has a wound to his left foot, recently area "opened up" and pt reports he is concerned about the area. Pt ambulatory.

## 2015-06-01 NOTE — ED Notes (Signed)
States he was walking around barefoot  And an ulcer area opened back up

## 2015-06-16 IMAGING — CT CT HEAD WITHOUT CONTRAST
2 of 3 series · 16 of 30 positions shown, 18 images · non-contrast
Comparison: Head CT 10/11/2013.  MRI 02/19/2012.

CLINICAL DATA: Shock.  Cardiopulmonary arrest.

EXAM:
CT HEAD WITHOUT CONTRAST
TECHNIQUE: Contiguous axial images were obtained from the base of the skull
through the vertex without intravenous contrast.

[Series 2: head wo · axial · 0.42mm/px · z∈[-51,+69]mm · 8 of 32 slices shown, 10 images]
[im 4/32  brain]
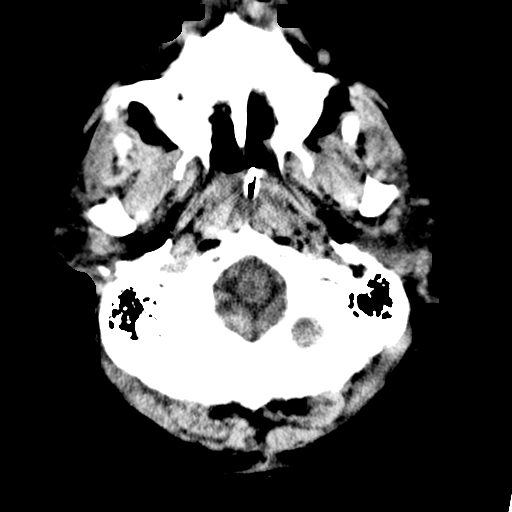
[im 4/32  bone]
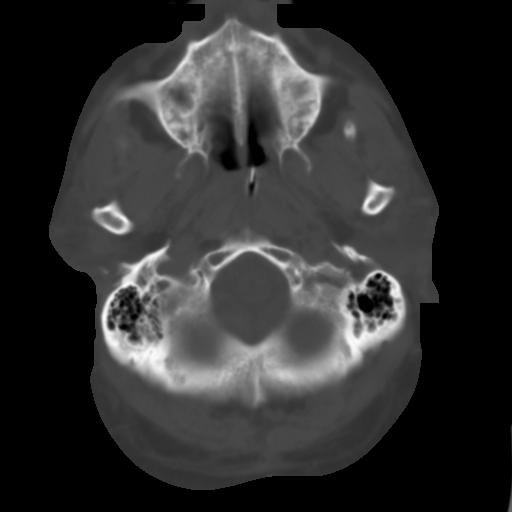
[im 7/32  brain]
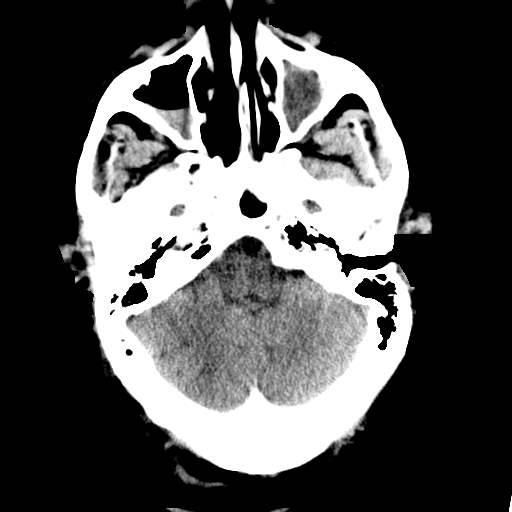
[im 11/32  brain]
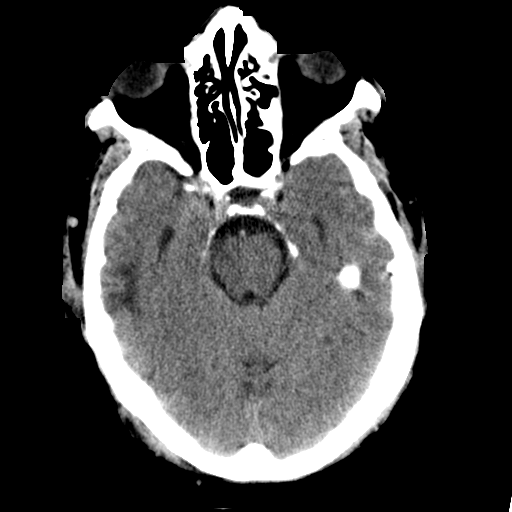
[im 14/32  brain]
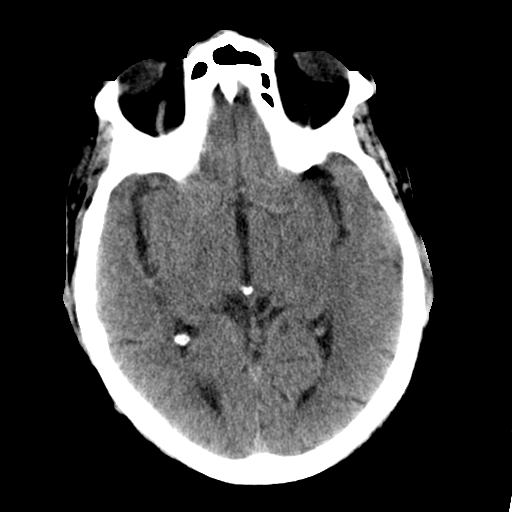
[im 18/32  brain]
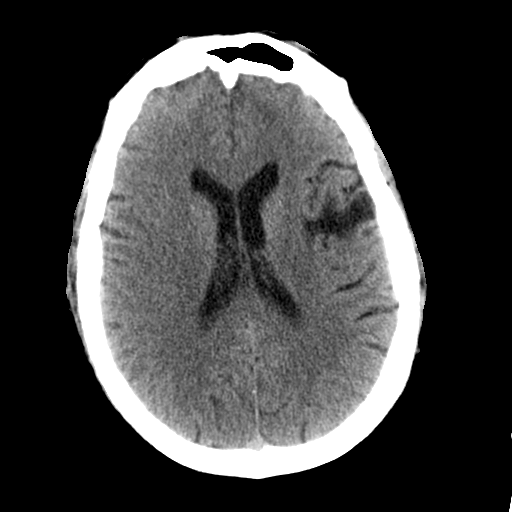
[im 18/32  bone]
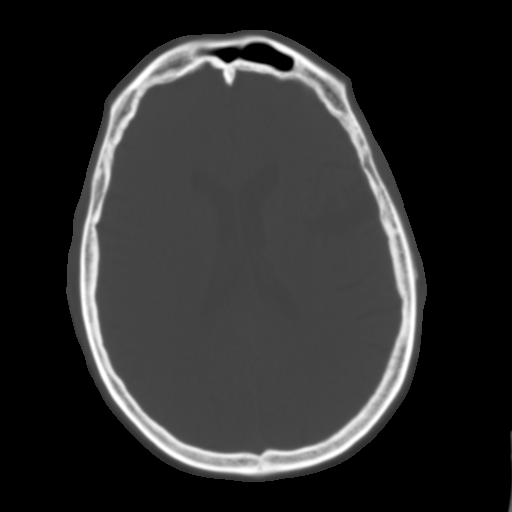
[im 21/32  brain]
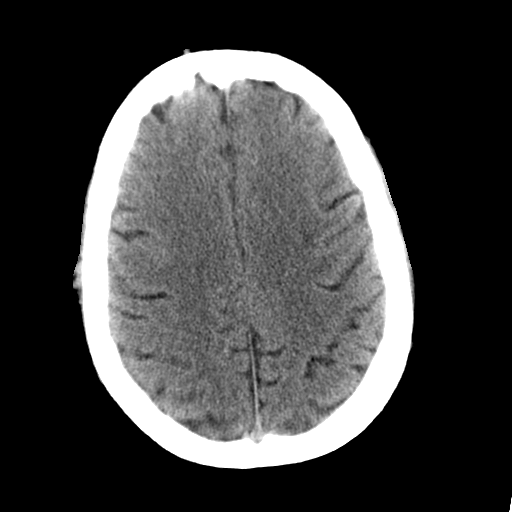
[im 25/32  brain]
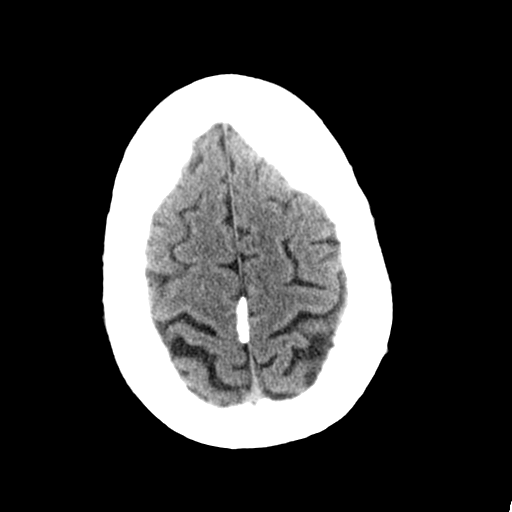
[im 28/32  brain]
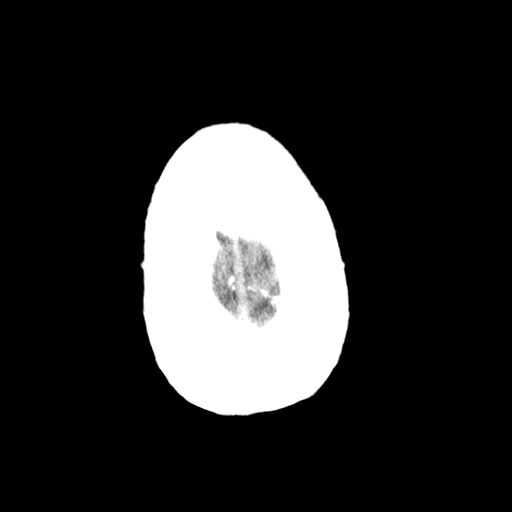

[Series 4: head wo recons · axial · 0.42mm/px · z∈[+26,+117]mm · 8 of 28 slices shown]
[im 4/28  brain]
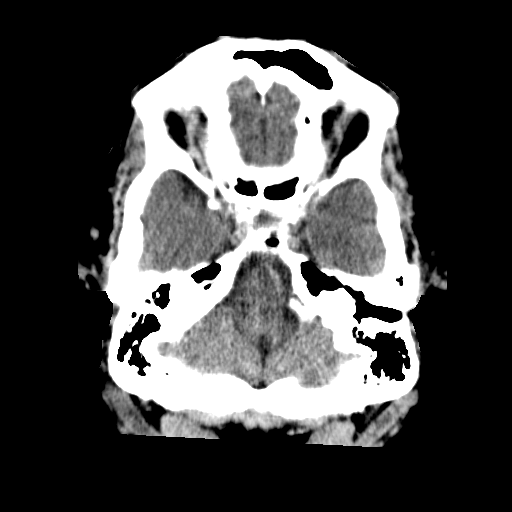
[im 7/28  brain]
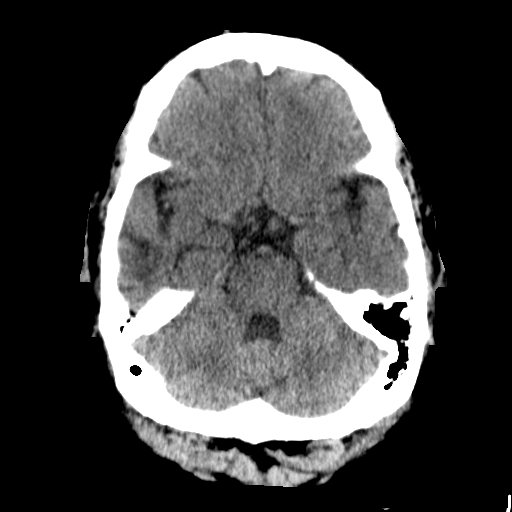
[im 10/28  brain]
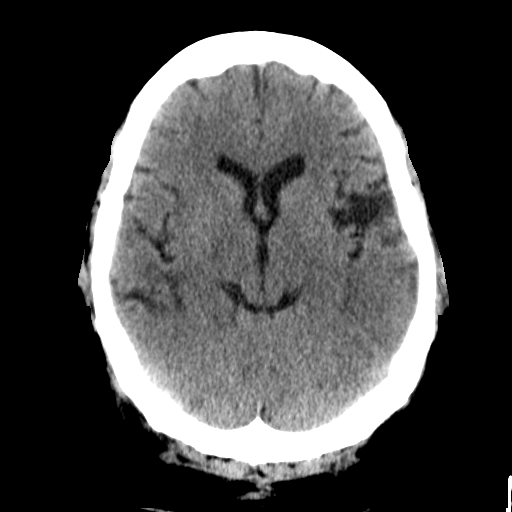
[im 13/28  brain]
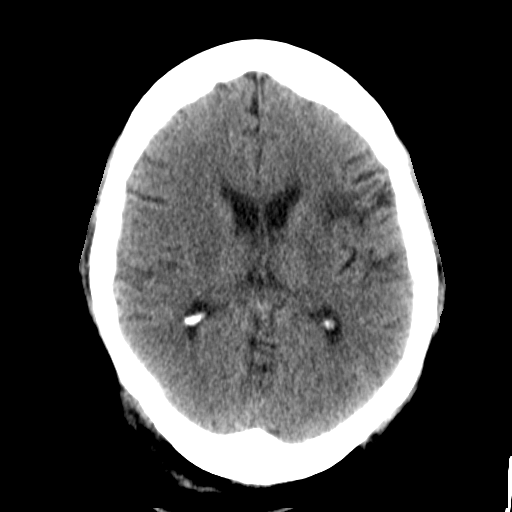
[im 16/28  brain]
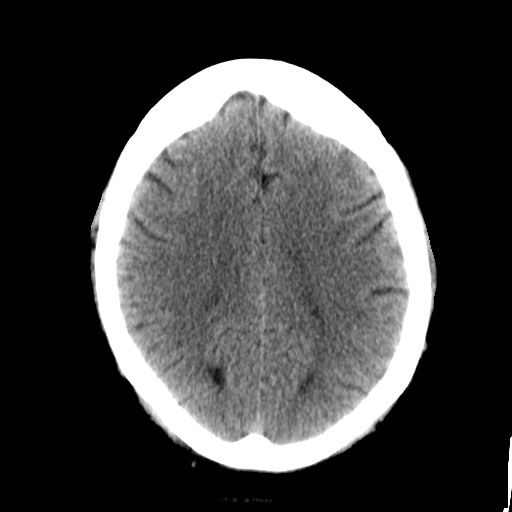
[im 19/28  brain]
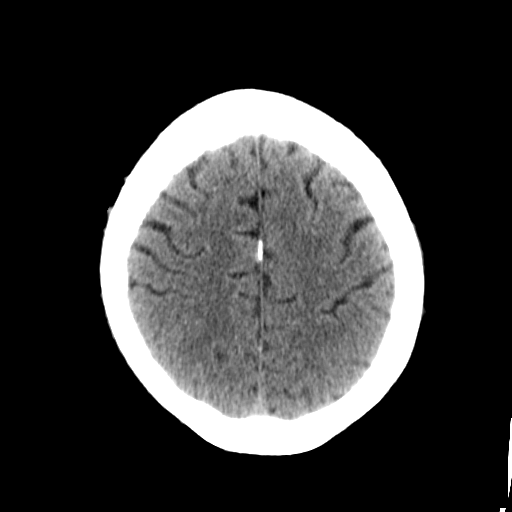
[im 22/28  brain]
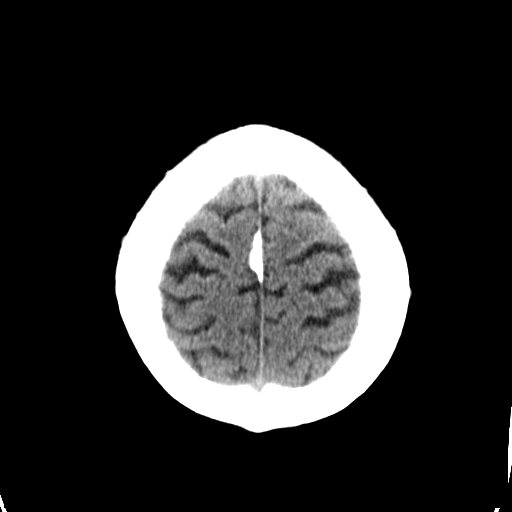
[im 25/28  brain]
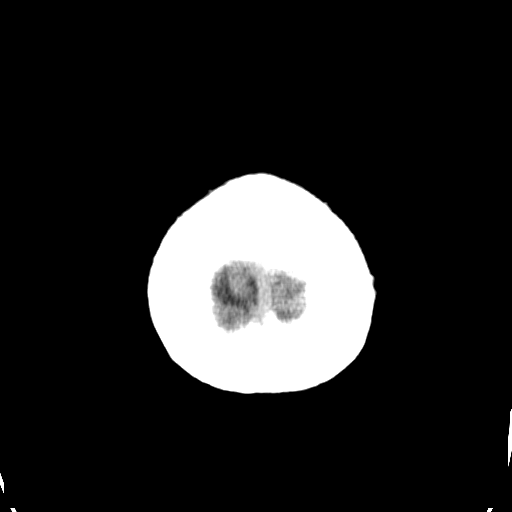

[16 of 30 positions shown; findings below may reference images not displayed]

FINDINGS: There is stable chronic right temporal and left frontal
encephalomalacia. Low-density in both cerebellar hemispheres is
stable. The component on the right is ill-defined and not
progressive compared with the recent study. There is no evidence of
acute intracranial hemorrhage, mass lesion, brain edema or
extra-axial fluid collection. There is no hydrocephalus.

The patient is intubated. There is increased opacification of
maxillary sinuses bilaterally. There is mild ethmoid sinus mucosal
thickening. The middle ears and mastoid air cells are clear. The
calvarium is intact.
IMPRESSION: Stable sequela of bilateral infarcts with encephalomalacia as
described. The recently noted subtle area of decreased density in
the right cerebellar hemisphere is unchanged, potentially subacute.
No evidence of acute intracranial hemorrhage.

New opacification of the maxillary sinuses bilaterally, likely
related to intubation.

## 2015-06-19 ENCOUNTER — Encounter: Payer: Self-pay | Admitting: *Deleted

## 2015-06-19 DIAGNOSIS — Z7902 Long term (current) use of antithrombotics/antiplatelets: Secondary | ICD-10-CM | POA: Diagnosis not present

## 2015-06-19 DIAGNOSIS — R040 Epistaxis: Secondary | ICD-10-CM | POA: Insufficient documentation

## 2015-06-19 DIAGNOSIS — Z794 Long term (current) use of insulin: Secondary | ICD-10-CM | POA: Diagnosis not present

## 2015-06-19 DIAGNOSIS — Z792 Long term (current) use of antibiotics: Secondary | ICD-10-CM | POA: Insufficient documentation

## 2015-06-19 DIAGNOSIS — I499 Cardiac arrhythmia, unspecified: Secondary | ICD-10-CM | POA: Insufficient documentation

## 2015-06-19 DIAGNOSIS — Z79899 Other long term (current) drug therapy: Secondary | ICD-10-CM | POA: Diagnosis not present

## 2015-06-19 DIAGNOSIS — Z91040 Latex allergy status: Secondary | ICD-10-CM | POA: Insufficient documentation

## 2015-06-19 DIAGNOSIS — Z87891 Personal history of nicotine dependence: Secondary | ICD-10-CM | POA: Diagnosis not present

## 2015-06-19 DIAGNOSIS — E119 Type 2 diabetes mellitus without complications: Secondary | ICD-10-CM | POA: Insufficient documentation

## 2015-06-19 DIAGNOSIS — I10 Essential (primary) hypertension: Secondary | ICD-10-CM | POA: Insufficient documentation

## 2015-06-19 NOTE — ED Notes (Addendum)
Pt presents w/ c/o persistent nosebleed. Pt is on Eliquis for a-fib. Pt is not bleeding presently.

## 2015-06-20 ENCOUNTER — Emergency Department
Admission: EM | Admit: 2015-06-20 | Discharge: 2015-06-20 | Disposition: A | Discharge status: Home / Self Care | Payer: BLUE CROSS/BLUE SHIELD | Attending: Emergency Medicine | Admitting: Emergency Medicine

## 2015-06-20 DIAGNOSIS — R040 Epistaxis: Secondary | ICD-10-CM

## 2015-06-20 HISTORY — DX: Unspecified atrial fibrillation: I48.91

## 2015-06-20 MED ORDER — SILVER NITRATE-POT NITRATE 75-25 % EX MISC
1.0000 "application " | Freq: Once | CUTANEOUS | Status: AC
  Administered 2015-06-20: 1 via TOPICAL

## 2015-06-20 MED ORDER — OXYMETAZOLINE HCL 0.05 % NA SOLN
NASAL | Status: AC
  Administered 2015-06-20: 1 via NASAL
  Filled 2015-06-20: qty 15

## 2015-06-20 MED ORDER — SILVER NITRATE-POT NITRATE 75-25 % EX MISC
CUTANEOUS | Status: AC
  Administered 2015-06-20: 1 via TOPICAL
  Filled 2015-06-20: qty 1

## 2015-06-20 MED ORDER — OXYMETAZOLINE HCL 0.05 % NA SOLN
1.0000 | Freq: Once | NASAL | Status: AC
  Administered 2015-06-20: 1 via NASAL

## 2015-06-20 NOTE — ED Provider Notes (Signed)
[facility] Emergency Department Provider Note   ____________________________________________  Time seen: Approximately 1 AM I have reviewed the triage vital signs and the triage nursing note.  HISTORY  Chief Complaint Epistaxis   Historian Patient  [name] is a 55 y.o. male who is on Eloquis for a history of A. fib, and started with left naris nosebleed a few days ago. It has been on and off since then. When it's bleeding it's like a "hot" but it does stop on its own after some pressure. Patient had another few episodes today and came in for evaluation. Nosebleed is stopped now. Next the next line patient does take Eloquis, but states that he is in the process of getting a new physician at the [facility], and he thinks that he is going to be told to discontinue it primarily but is not sure. He thinks he is run out of Eliquis, with last dose today.  Symptoms are mild to moderate.    Past Medical History  Diagnosis Date  . Coronary artery disease   . Diabetes mellitus without complication (HCC)   . Hypertension   . CHF (congestive heart failure) (HCC)   . Atrial fibrillation (HCC)     There are no active problems to display for this patient.   History reviewed. No pertinent past surgical history.  Current Outpatient Rx  Name  Route  Sig  Dispense  Refill  . amiodarone (PACERONE) 200 MG tablet   Oral   Take 200 mg by mouth 2 (two) times daily.         . carvedilol (COREG) 3.125 MG tablet   Oral   Take 1.562 mg by mouth 2 (two) times daily with a meal.         . digoxin (LANOXIN) 0.25 MG tablet   Oral   Take 0.125 mg by mouth daily. Patient is taking differently than prescribed, only takes half a tablet         . furosemide (LASIX) 40 MG tablet   Oral   Take 40 mg by mouth daily.         . insulin NPH-regular Human (NOVOLIN 70/30) (70-30) 100 UNIT/ML injection   Subcutaneous   Inject 24 Units into the skin every morning.          . insulin NPH-regular Human (NOVOLIN 70/30) (70-30) 100 UNIT/ML injection   Subcutaneous   Inject 22 Units into the skin every evening.         . potassium chloride SA (K-DUR,KLOR-CON) 20 MEQ tablet   Oral   Take 20 mEq by mouth daily.         Marland Kitchen. sulfamethoxazole-trimethoprim (BACTRIM DS,SEPTRA DS) 800-160 MG tablet   Oral   Take 1 tablet by mouth 2 (two) times daily.   20 tablet   0   . traMADol (ULTRAM) 50 MG tablet   Oral   Take 50 mg by mouth daily as needed.         . warfarin (COUMADIN) 5 MG tablet   Oral   Take 5 mg by mouth daily. Per pharmacy, direction says take as directed by provider           Allergies Lisinopril and Latex  History reviewed. No pertinent family history.  Social History Social History  Substance Use Topics  . Smoking status: Former Games developermoker  . Smokeless tobacco: Never Used  . Alcohol Use: Yes     Comment: occasionally    Review of Systems  Constitutional: Negative for fever. Eyes: Negative for visual changes. ENT: Negative for sore throat. Cardiovascular: Negative for chest pain. Respiratory: Negative for shortness of breath. Gastrointestinal: Negative for abdominal pain, vomiting and diarrhea. Genitourinary: Negative for dysuria. Musculoskeletal: Negative for back pain. Skin: Negative for rash. Neurological: Negative for headache. 10 point Review of Systems otherwise negative ____________________________________________   PHYSICAL EXAM:  VITAL SIGNS: ED Triage Vitals  Enc Vitals Group     BP 06/19/15 2239 132/95 mmHg     Pulse Rate 06/19/15 2239 92     Resp 06/19/15 2239 16     Temp 06/19/15 2239 98.3 F (36.8 C)     Temp Source 06/19/15 2239 Oral     SpO2 06/19/15 2239 98 %     Weight 06/19/15 2239 270 lb (122.471 kg)     Height 06/19/15 2239  (1.727 m)     Head Cir --      Peak Flow --      Pain Score 06/19/15 2243 0     Pain Loc --      Pain Edu? --      Excl. in GC? --      Constitutional:  Alert and oriented. Well appearing and in no distress. HEENT   Head: Normocephalic and atraumatic.      Eyes: Conjunctivae are normal. PERRL. Normal extraocular movements.      Ears:         Nose: Anterior nasal septum of the left naris with blood clot, and anterior portion is erythematous.   Mouth/Throat: Mucous membranes are moist.   Neck: No stridor. Cardiovascular/Chest:Irregularly irregular, normal rate.  No murmurs, rubs, or gallops. Respiratory: No respiratory distress Gastrointestinal: Deferred  Genitourinary/rectal:Deferred Musculoskeletal: Normal appearance of the extremities. Neurologic:  Normal speech and language. Moving 4 extremities. Skin:  Skin is warm, dry and intact. No rash noted. Psychiatric: Mood and affect are normal. Speech and behavior are normal. Patient exhibits appropriate insight and judgment.  ____________________________________________   EKG I, Governor Rooks, MD, the attending physician have personally viewed and interpreted all ECGs.  None ____________________________________________  LABS (pertinent positives/negatives)  None  ____________________________________________  RADIOLOGY All Xrays were viewed by me. Imaging interpreted by Radiologist.  None __________________________________________  PROCEDURES  Procedure(s) performed: Nasal cauterization for nosebleed. Site location left anterior naris. Afrin applied before. No complications..  Critical Care performed: None  ____________________________________________   ED COURSE / ASSESSMENT AND PLAN  Pertinent labs & imaging results that were available during my care of the patient were reviewed by me and considered in my medical decision making (see chart for details).   I did discuss with him risks versus benefit and chose to hold the Eliquis for about 3 days. In terms of restarting Eliquis, he needs to find out from his primary care physician.  He did have a adherent  clot, that I did not want to dislodge. I did apply Afrin, and cauterized the upper erythematous area just above the clot.    CONSULTATIONS:   None   Patient / Family / Caregiver informed of clinical course, medical decision-making process, and agree with plan.   I discussed return precautions, follow-up instructions, and discharged instructions with patient and/or family.   ___________________________________________   FINAL CLINICAL IMPRESSION(S) / ED DIAGNOSES   Final diagnoses:  Epistaxis, recurrent              Note: This dictation was prepared with Dragon dictation. Any transcriptional errors that result from this process are unintentional  Governor Rooks, MD 06/20/15 [phone]

## 2015-06-20 NOTE — Discharge Instructions (Signed)
You were evaluated due to recurrent nosebleeds.  Stop Your blood thinner for 3 days and discuss with your primary care doctor about restarting it.  If you are still having intermittent nosebleeds after 3 days, you may need to make an appointment with a earspecialist, Dr. Carmie Kanner information provided.  Return to the emergency department for any worsening condition including nosebleed which does not stop on its own after pressure for 15-20 minutes.  You may try one to 2 sprays in each nostril twice daily for 3 days to help prevent the nosebleeds.   Nosebleed Nosebleeds are common. They are due to a crack in the inside lining of your nose (mucous membrane) or from a small blood vessel that starts to bleed. Nosebleeds can be caused by many conditions, such as injury, infections, dry mucous membranes or dry climate, medicines, nose picking, and home heating and cooling systems. Most nosebleeds come from blood vessels in the front of your nose. HOME CARE INSTRUCTIONS   Try controlling your nosebleed by pinching your nostrils gently and continuously for at least 10 minutes.  Avoid blowing or sniffing your nose for a number of hours after having a nosebleed.  Do not put gauze inside your nose yourself. If your nose was packed by your health care provider, try to maintain the pack inside of your nose until your health care provider removes it.  If a gauze pack was used and it starts to fall out, gently replace it or cut off the end of it.  If a balloon catheter was used to pack your nose, do not cut or remove it unless your health care provider has instructed you to do that.  Avoid lying down while you are having a nosebleed. Sit up and lean forward.  Use a nasal spray decongestant to help with a nosebleed as directed by your health care provider.  Do not use petroleum jelly or mineral oil in your nose. These can drip into your lungs.  Maintain humidity in your home by using less air conditioning  or by using a humidifier.  Aspirinand blood thinners make bleeding more likely. If you are prescribed these medicines and you suffer from nosebleeds, ask your health care provider if you should stop taking the medicines or adjust the dose. Do not stop medicines unless directed by your health care provider  Resume your normal activities as you are able, but avoid straining, lifting, or bending at the waist for several days.  If your nosebleed was caused by dry mucous membranes, use over-the-counter saline nasal spray or gel. This will keep the mucous membranes moist and allow them to heal. If you must use a lubricant, choose the water-soluble variety. Use it only sparingly, and do not use it within several hours of lying down.  Keep all follow-up visits as directed by your health care provider. This is important. SEEK MEDICAL CARE IF:  You have a fever.  You get frequent nosebleeds.  You are getting nosebleeds more often. SEEK IMMEDIATE MEDICAL CARE IF:  Your nosebleed lasts longer than 20 minutes.  Your nosebleed occurs after an injury to your face, and your nose looks crooked or broken.  You have unusual bleeding from other parts of your body.  You have unusual bruising on other parts of your body.  You feel light-headed or you faint.  You become sweaty.  You vomit blood.  Your nosebleed occurs after a head injury.   This information is not intended to replace advice given to you  by your health care provider. Make sure you discuss any questions you have with your health care provider.   Document Released: 12/21/2004 Document Revised: 04/03/2014 Document Reviewed: 10/27/2013 Elsevier Interactive Patient Education [employer] Elsevier Inc.

## 2015-08-14 ENCOUNTER — Emergency Department
Admission: EM | Admit: 2015-08-14 | Discharge: 2015-08-14 | Disposition: A | Discharge status: Home / Self Care | Payer: BLUE CROSS/BLUE SHIELD | Attending: Emergency Medicine | Admitting: Emergency Medicine

## 2015-08-14 ENCOUNTER — Encounter: Payer: Self-pay | Admitting: Emergency Medicine

## 2015-08-14 DIAGNOSIS — I509 Heart failure, unspecified: Secondary | ICD-10-CM | POA: Insufficient documentation

## 2015-08-14 DIAGNOSIS — Z7901 Long term (current) use of anticoagulants: Secondary | ICD-10-CM | POA: Insufficient documentation

## 2015-08-14 DIAGNOSIS — R040 Epistaxis: Secondary | ICD-10-CM | POA: Diagnosis not present

## 2015-08-14 DIAGNOSIS — E119 Type 2 diabetes mellitus without complications: Secondary | ICD-10-CM | POA: Diagnosis not present

## 2015-08-14 DIAGNOSIS — I2510 Atherosclerotic heart disease of native coronary artery without angina pectoris: Secondary | ICD-10-CM | POA: Diagnosis not present

## 2015-08-14 DIAGNOSIS — Z794 Long term (current) use of insulin: Secondary | ICD-10-CM | POA: Diagnosis not present

## 2015-08-14 DIAGNOSIS — Z79899 Other long term (current) drug therapy: Secondary | ICD-10-CM | POA: Insufficient documentation

## 2015-08-14 DIAGNOSIS — Z87891 Personal history of nicotine dependence: Secondary | ICD-10-CM | POA: Diagnosis not present

## 2015-08-14 DIAGNOSIS — I4891 Unspecified atrial fibrillation: Secondary | ICD-10-CM | POA: Diagnosis not present

## 2015-08-14 DIAGNOSIS — I110 Hypertensive heart disease with heart failure: Secondary | ICD-10-CM | POA: Diagnosis not present

## 2015-08-14 DIAGNOSIS — Z792 Long term (current) use of antibiotics: Secondary | ICD-10-CM | POA: Diagnosis not present

## 2015-08-14 MED ORDER — CEPHALEXIN 500 MG PO CAPS: 500.0000 mg | ORAL_CAPSULE | Freq: Three times a day (TID) | ORAL | Status: DC

## 2015-08-14 MED ORDER — CEPHALEXIN 500 MG PO CAPS
500.0000 mg | ORAL_CAPSULE | Freq: Once | ORAL | Status: AC
  Administered 2015-08-14: 500 mg via ORAL
  Filled 2015-08-14: qty 1

## 2015-08-14 MED ORDER — OXYMETAZOLINE HCL 0.05 % NA SOLN
1.0000 | Freq: Once | NASAL | Status: AC
  Administered 2015-08-14: 1 via NASAL
  Filled 2015-08-14: qty 15

## 2015-08-14 MED ORDER — OXYCODONE-ACETAMINOPHEN 5-325 MG PO TABS: 1.0000 | ORAL_TABLET | Freq: Four times a day (QID) | ORAL | Status: DC | PRN

## 2015-08-14 NOTE — ED Provider Notes (Signed)
[facility] Emergency Department Provider Note  ____________________________________________  Time seen: Approximately 5:09 PM  I have reviewed the triage vital signs and the nursing notes.   HISTORY  Chief Complaint Epistaxis    [name] is a 55 y.o. male who presents with nosebleed, left nare, for 2 hours. Started while he was at work. A gauze plug was inserted at that time. He is currently on aspirin. His last dose of eliquis was 3 weeks ago. he had a nosebleed yesterday as well and was seen at the [facility]. He reports that cauterizing was performed. He denies other symptoms of congestion, fever. No headache, or lightheadedness. See review of systems.   Past Medical History  Diagnosis Date  . Coronary artery disease   . Diabetes mellitus without complication (HCC)   . Hypertension   . CHF (congestive heart failure) (HCC)   . Atrial fibrillation (HCC)     There are no active problems to display for this patient.   History reviewed. No pertinent past surgical history.  Current Outpatient Rx  Name  Route  Sig  Dispense  Refill  . amiodarone (PACERONE) 200 MG tablet   Oral   Take 200 mg by mouth 2 (two) times daily.         . carvedilol (COREG) 3.125 MG tablet   Oral   Take 1.562 mg by mouth 2 (two) times daily with a meal.         . cephALEXin (KEFLEX) 500 MG capsule   Oral   Take 1 capsule (500 mg total) by mouth 3 (three) times daily.   21 capsule   0   . digoxin (LANOXIN) 0.25 MG tablet   Oral   Take 0.125 mg by mouth daily. Patient is taking differently than prescribed, only takes half a tablet         . furosemide (LASIX) 40 MG tablet   Oral   Take 40 mg by mouth daily.         . insulin NPH-regular Human (NOVOLIN 70/30) (70-30) 100 UNIT/ML injection   Subcutaneous   Inject 24 Units into the skin every morning.         . insulin NPH-regular Human (NOVOLIN 70/30) (70-30) 100 UNIT/ML injection   Subcutaneous    Inject 22 Units into the skin every evening.         Marland Kitchen. oxyCODONE-acetaminophen (ROXICET) 5-325 MG tablet   Oral   Take 1 tablet by mouth every 6 (six) hours as needed.   10 tablet   0   . potassium chloride SA (K-DUR,KLOR-CON) 20 MEQ tablet   Oral   Take 20 mEq by mouth daily.         Marland Kitchen. sulfamethoxazole-trimethoprim (BACTRIM DS,SEPTRA DS) 800-160 MG tablet   Oral   Take 1 tablet by mouth 2 (two) times daily.   20 tablet   0   . traMADol (ULTRAM) 50 MG tablet   Oral   Take 50 mg by mouth daily as needed.         . warfarin (COUMADIN) 5 MG tablet   Oral   Take 5 mg by mouth daily. Per pharmacy, direction says take as directed by provider           Allergies Lisinopril and Latex  No family history on file.  Social History Social History  Substance Use Topics  . Smoking status: Former Games developermoker  . Smokeless tobacco: Never Used  . Alcohol Use: Yes  Comment: occasionally    Review of Systems Constitutional: No fever/chills Eyes: No visual changes. ENT: No sore throat. Cardiovascular: Denies chest pain. Respiratory: Denies shortness of breath. Gastrointestinal: No abdominal pain.  No nausea, no vomiting.  No diarrhea.  No constipation. Genitourinary: Negative for dysuria. Musculoskeletal: Negative for back pain. Skin: Negative for rash. Neurological: Negative for headaches, focal weakness or numbness. 10-point ROS otherwise negative.  ____________________________________________   PHYSICAL EXAM:  VITAL SIGNS: ED Triage Vitals  Enc Vitals Group     BP 08/14/15 1522 128/87 mmHg     Pulse Rate 08/14/15 1522 61     Resp 08/14/15 1522 20     Temp 08/14/15 1522 99 F (37.2 C)     Temp Source 08/14/15 1522 Oral     SpO2 08/14/15 1522 99 %     Weight 08/14/15 1522 270 lb (122.471 kg)     Height 08/14/15 1522  (1.727 m)     Head Cir --      Peak Flow --      Pain Score --      Pain Loc --      Pain Edu? --      Excl. in GC? --      Constitutional: Alert and oriented. Well appearing and in no acute distress. Eyes: Conjunctivae are normal. PERRL. EOMI. Head: Atraumatic. Nose:Bright red blood in both nasal passages. Left nare with continuous dripping. Mouth/Throat: Mucous membranes are moist.  Oropharynx non-erythematous. No lesions. Neck:  Supple.  No adenopathy.   Cardiovascular: Normal rate, regular rhythm. Grossly normal heart sounds.  Good peripheral circulation. Respiratory: Normal respiratory effort.  No retractions. Lungs CTAB. Musculoskeletal: Nml ROM of upper and lower extremity joints. Neurologic:  Normal speech and language. No gross focal neurologic deficits are appreciated. No gait instability. Skin:  Skin is warm, dry and intact. No rash noted. Psychiatric: Mood and affect are normal. Speech and behavior are normal.  ____________________________________________   LABS (all labs ordered are listed, but only abnormal results are displayed)  Labs Reviewed - No data to display ____________________________________________  EKG    ____________________________________________  RADIOLOGY    ____________________________________________   PROCEDURES  Procedure(s) performed: Trial of Afrin nasal spray with clamping for 15 minutes. This was unsuccessful. Then a Rhino rocket 5.5 cm was placed after procedure explained. It was inserted fully. Approximately 3 mL of saline used to blow up the balloon. Then secured to the face. Patient tolerated well. Small amount of blood noted dripping to the right nasal passage.   Critical Care performed: No  ____________________________________________   INITIAL IMPRESSION / ASSESSMENT AND PLAN / ED COURSE  Pertinent labs & imaging results that were available during my care of the patient were reviewed by me and considered in my medical decision making (see chart for details).  55 year old male with history of recurrent epistaxis. Was seen at the [facility] yesterday and silver nitrate was performed to stop his nasal bleeding. However bleeding returned today. After one attempt with Afrin, packing was performed with a Rhino rocket. See procedure above. Started on Keflex. Patient will contact ENT physician in 2 days for follow-up. He will return to the emergency room for any worsening symptoms.   ____________________________________________   FINAL CLINICAL IMPRESSION(S) / ED DIAGNOSES  Final diagnoses:  Epistaxis, recurrent      Ignacia Bayley, PA-C 08/14/15 1816  Sharyn Creamer, MD 08/14/15 2102

## 2015-08-14 NOTE — ED Notes (Signed)
Nosebleed x 2 hours. States has been seen here previously for same problem.

## 2015-08-14 NOTE — ED Notes (Signed)
Pt c/o nose bleed xfew hours, pt hx of same.

## 2015-08-14 NOTE — Discharge Instructions (Signed)
Nosebleed Nosebleeds are common. They are due to a crack in the inside lining of your nose (mucous membrane) or from a small blood vessel that starts to bleed. Nosebleeds can be caused by many conditions, such as injury, infections, dry mucous membranes or dry climate, medicines, nose picking, and home heating and cooling systems. Most nosebleeds come from blood vessels in the front of your nose. HOME CARE INSTRUCTIONS   Try controlling your nosebleed by pinching your nostrils gently and continuously for at least 10 minutes.  Avoid blowing or sniffing your nose for a number of hours after having a nosebleed.  Do not put gauze inside your nose yourself. If your nose was packed by your health care provider, try to maintain the pack inside of your nose until your health care provider removes it.  If a gauze pack was used and it starts to fall out, gently replace it or cut off the end of it.  If a balloon catheter was used to pack your nose, do not cut or remove it unless your health care provider has instructed you to do that.  Avoid lying down while you are having a nosebleed. Sit up and lean forward.  Use a nasal spray decongestant to help with a nosebleed as directed by your health care provider.  Do not use petroleum jelly or mineral oil in your nose. These can drip into your lungs.  Maintain humidity in your home by using less air conditioning or by using a humidifier.  Aspirinand blood thinners make bleeding more likely. If you are prescribed these medicines and you suffer from nosebleeds, ask your health care provider if you should stop taking the medicines or adjust the dose. Do not stop medicines unless directed by your health care provider  Resume your normal activities as you are able, but avoid straining, lifting, or bending at the waist for several days.  If your nosebleed was caused by dry mucous membranes, use over-the-counter saline nasal spray or gel. This will keep the  mucous membranes moist and allow them to heal. If you must use a lubricant, choose the water-soluble variety. Use it only sparingly, and do not use it within several hours of lying down.  Keep all follow-up visits as directed by your health care provider. This is important. SEEK MEDICAL CARE IF:  You have a fever.  You get frequent nosebleeds.  You are getting nosebleeds more often. SEEK IMMEDIATE MEDICAL CARE IF:  Your nosebleed lasts longer than 20 minutes.  Your nosebleed occurs after an injury to your face, and your nose looks crooked or broken.  You have unusual bleeding from other parts of your body.  You have unusual bruising on other parts of your body.  You feel light-headed or you faint.  You become sweaty.  You vomit blood.  Your nosebleed occurs after a head injury.   This information is not intended to replace advice given to you by your health care provider. Make sure you discuss any questions you have with your health care provider.   Document Released: 12/21/2004 Document Revised: 04/03/2014 Document Reviewed: 10/27/2013 Elsevier Interactive Patient Education 2016 [employer]   Take antibiotics as directed. Use pain medicine as needed. Contact ENT physician on Monday for a follow-up.

## 2015-08-14 NOTE — ED Notes (Signed)
Attempted nose clamp x2 with no success, clamp would not stay on pt.

## 2016-05-22 ENCOUNTER — Emergency Department: Payer: BLUE CROSS/BLUE SHIELD

## 2016-05-22 ENCOUNTER — Inpatient Hospital Stay
Admission: EM | Admit: 2016-05-22 | Discharge: 2016-05-26 | DRG: 074 | Disposition: A | Discharge status: Home Health | Payer: BLUE CROSS/BLUE SHIELD | Attending: Internal Medicine | Admitting: Internal Medicine

## 2016-05-22 DIAGNOSIS — A419 Sepsis, unspecified organism: Secondary | ICD-10-CM

## 2016-05-22 DIAGNOSIS — Z87891 Personal history of nicotine dependence: Secondary | ICD-10-CM

## 2016-05-22 DIAGNOSIS — Z833 Family history of diabetes mellitus: Secondary | ICD-10-CM

## 2016-05-22 DIAGNOSIS — Z794 Long term (current) use of insulin: Secondary | ICD-10-CM

## 2016-05-22 DIAGNOSIS — I4821 Permanent atrial fibrillation: Secondary | ICD-10-CM

## 2016-05-22 DIAGNOSIS — M86172 Other acute osteomyelitis, left ankle and foot: Secondary | ICD-10-CM

## 2016-05-22 DIAGNOSIS — Z823 Family history of stroke: Secondary | ICD-10-CM

## 2016-05-22 DIAGNOSIS — E11610 Type 2 diabetes mellitus with diabetic neuropathic arthropathy: Secondary | ICD-10-CM | POA: Diagnosis present

## 2016-05-22 DIAGNOSIS — Z8249 Family history of ischemic heart disease and other diseases of the circulatory system: Secondary | ICD-10-CM | POA: Diagnosis not present

## 2016-05-22 DIAGNOSIS — Z79899 Other long term (current) drug therapy: Secondary | ICD-10-CM

## 2016-05-22 DIAGNOSIS — M869 Osteomyelitis, unspecified: Secondary | ICD-10-CM | POA: Diagnosis present

## 2016-05-22 DIAGNOSIS — I110 Hypertensive heart disease with heart failure: Secondary | ICD-10-CM | POA: Diagnosis present

## 2016-05-22 DIAGNOSIS — Z86718 Personal history of other venous thrombosis and embolism: Secondary | ICD-10-CM

## 2016-05-22 DIAGNOSIS — Z7901 Long term (current) use of anticoagulants: Secondary | ICD-10-CM

## 2016-05-22 DIAGNOSIS — M009 Pyogenic arthritis, unspecified: Secondary | ICD-10-CM

## 2016-05-22 DIAGNOSIS — I480 Paroxysmal atrial fibrillation: Secondary | ICD-10-CM | POA: Diagnosis present

## 2016-05-22 DIAGNOSIS — I509 Heart failure, unspecified: Secondary | ICD-10-CM | POA: Diagnosis present

## 2016-05-22 DIAGNOSIS — I10 Essential (primary) hypertension: Secondary | ICD-10-CM | POA: Diagnosis present

## 2016-05-22 DIAGNOSIS — I2510 Atherosclerotic heart disease of native coronary artery without angina pectoris: Secondary | ICD-10-CM | POA: Diagnosis present

## 2016-05-22 DIAGNOSIS — I482 Chronic atrial fibrillation: Secondary | ICD-10-CM | POA: Diagnosis present

## 2016-05-22 DIAGNOSIS — E119 Type 2 diabetes mellitus without complications: Secondary | ICD-10-CM

## 2016-05-22 HISTORY — DX: Acute embolism and thrombosis of unspecified deep veins of unspecified lower extremity: I82.409

## 2016-05-22 LAB — CBC WITH DIFFERENTIAL/PLATELET
BASOS ABS: 0.1 10*3/uL (ref 0–0.1)
BASOS PCT: 1 %
Eosinophils Absolute: 0 10*3/uL (ref 0–0.7)
Eosinophils Relative: 0 %
HEMATOCRIT: 32 % — AB (ref 40.0–52.0)
HEMOGLOBIN: 10.9 g/dL — AB (ref 13.0–18.0)
Lymphocytes Relative: 25 %
Lymphs Abs: 2.2 10*3/uL (ref 1.0–3.6)
MCH: 30.7 pg (ref 26.0–34.0)
MCHC: 34 g/dL (ref 32.0–36.0)
MCV: 90.3 fL (ref 80.0–100.0)
Monocytes Absolute: 0.8 10*3/uL (ref 0.2–1.0)
Monocytes Relative: 9 %
NEUTROS ABS: 5.7 10*3/uL (ref 1.4–6.5)
NEUTROS PCT: 65 %
Platelets: 286 10*3/uL (ref 150–440)
RBC: 3.54 MIL/uL — ABNORMAL LOW (ref 4.40–5.90)
RDW: 14.7 % — ABNORMAL HIGH (ref 11.5–14.5)
WBC: 8.8 10*3/uL (ref 3.8–10.6)

## 2016-05-22 LAB — GLUCOSE, CAPILLARY: GLUCOSE-CAPILLARY: 176 mg/dL — AB (ref 65–99)

## 2016-05-22 LAB — COMPREHENSIVE METABOLIC PANEL
ALBUMIN: 3.2 g/dL — AB (ref 3.5–5.0)
ALT: 11 U/L — ABNORMAL LOW (ref 17–63)
ANION GAP: 9 (ref 5–15)
AST: 16 U/L (ref 15–41)
Alkaline Phosphatase: 74 U/L (ref 38–126)
BILIRUBIN TOTAL: 0.7 mg/dL (ref 0.3–1.2)
BUN: 14 mg/dL (ref 6–20)
CO2: 25 mmol/L (ref 22–32)
Calcium: 9 mg/dL (ref 8.9–10.3)
Chloride: 101 mmol/L (ref 101–111)
Creatinine, Ser: 0.79 mg/dL (ref 0.61–1.24)
GFR calc Af Amer: >60 mL/min (ref >60)
GLUCOSE: 225 mg/dL — AB (ref 65–99)
POTASSIUM: 4.1 mmol/L (ref 3.5–5.1)
Sodium: 135 mmol/L (ref 135–145)
TOTAL PROTEIN: 7.8 g/dL (ref 6.5–8.1)

## 2016-05-22 LAB — PROCALCITONIN

## 2016-05-22 LAB — PROTIME-INR
INR: 1.33
PROTHROMBIN TIME: 16.6 s — AB (ref 11.4–15.2)

## 2016-05-22 LAB — LACTIC ACID, PLASMA: LACTIC ACID, VENOUS: 1.8 mmol/L (ref 0.5–1.9)

## 2016-05-22 MED ORDER — PIPERACILLIN-TAZOBACTAM 4.5 G IVPB
4.5000 g | Freq: Three times a day (TID) | INTRAVENOUS | Status: DC
Start: 2016-05-23 — End: 2016-05-23
  Administered 2016-05-23 (×2): 4.5 g via INTRAVENOUS
  Filled 2016-05-22 (×4): qty 100

## 2016-05-22 MED ORDER — INSULIN ASPART 100 UNIT/ML ~~LOC~~ SOLN: 0.0000 [IU] | Freq: Every day | SUBCUTANEOUS | Status: DC

## 2016-05-22 MED ORDER — PIPERACILLIN-TAZOBACTAM 3.375 G IVPB 30 MIN
3.3750 g | Freq: Once | INTRAVENOUS | Status: AC
  Administered 2016-05-22: 3.375 g via INTRAVENOUS
  Filled 2016-05-22: qty 50

## 2016-05-22 MED ORDER — SODIUM CHLORIDE 0.9 % IV SOLN
INTRAVENOUS | Status: DC
  Administered 2016-05-22: via INTRAVENOUS

## 2016-05-22 MED ORDER — VANCOMYCIN HCL 10 G IV SOLR
1250.0000 mg | Freq: Three times a day (TID) | INTRAVENOUS | Status: DC
  Administered 2016-05-23 – 2016-05-24 (×5): 1250 mg via INTRAVENOUS
  Filled 2016-05-22 (×8): qty 1250

## 2016-05-22 MED ORDER — ENOXAPARIN SODIUM 40 MG/0.4ML ~~LOC~~ SOLN
40.0000 mg | Freq: Two times a day (BID) | SUBCUTANEOUS | Status: DC
  Administered 2016-05-23 – 2016-05-26 (×8): 40 mg via SUBCUTANEOUS
  Filled 2016-05-22 (×8): qty 0.4

## 2016-05-22 MED ORDER — SODIUM CHLORIDE 0.9 % IV SOLN
2000.0000 mg | Freq: Once | INTRAVENOUS | Status: AC
  Administered 2016-05-22: 2000 mg via INTRAVENOUS
  Filled 2016-05-22: qty 2000

## 2016-05-22 MED ORDER — ACETAMINOPHEN 325 MG PO TABS
650.0000 mg | ORAL_TABLET | Freq: Four times a day (QID) | ORAL | Status: DC | PRN
  Filled 2016-05-22: qty 2

## 2016-05-22 MED ORDER — LOSARTAN POTASSIUM 50 MG PO TABS
100.0000 mg | ORAL_TABLET | Freq: Every day | ORAL | Status: DC
  Administered 2016-05-23 – 2016-05-24 (×2): 100 mg via ORAL
  Filled 2016-05-22 (×3): qty 2

## 2016-05-22 MED ORDER — SODIUM CHLORIDE 0.9 % IV BOLUS (SEPSIS)
1000.0000 mL | Freq: Once | INTRAVENOUS | Status: AC
  Administered 2016-05-22: 1000 mL via INTRAVENOUS

## 2016-05-22 MED ORDER — ONDANSETRON HCL 4 MG/2ML IJ SOLN: 4.0000 mg | Freq: Four times a day (QID) | INTRAMUSCULAR | Status: DC | PRN

## 2016-05-22 MED ORDER — ACETAMINOPHEN 650 MG RE SUPP: 650.0000 mg | Freq: Four times a day (QID) | RECTAL | Status: DC | PRN

## 2016-05-22 MED ORDER — ATORVASTATIN CALCIUM 20 MG PO TABS
80.0000 mg | ORAL_TABLET | Freq: Every evening | ORAL | Status: DC
  Administered 2016-05-23 – 2016-05-25 (×3): 80 mg via ORAL
  Filled 2016-05-22 (×3): qty 4

## 2016-05-22 MED ORDER — DILTIAZEM HCL ER COATED BEADS 240 MG PO CP24
240.0000 mg | ORAL_CAPSULE | Freq: Every day | ORAL | Status: DC
  Administered 2016-05-23 – 2016-05-24 (×2): 240 mg via ORAL
  Filled 2016-05-22 (×2): qty 1

## 2016-05-22 MED ORDER — INSULIN ASPART 100 UNIT/ML ~~LOC~~ SOLN: 0.0000 [IU] | Freq: Three times a day (TID) | SUBCUTANEOUS | Status: DC

## 2016-05-22 MED ORDER — ONDANSETRON HCL 4 MG PO TABS: 4.0000 mg | ORAL_TABLET | Freq: Four times a day (QID) | ORAL | Status: DC | PRN

## 2016-05-22 MED ORDER — OXYCODONE-ACETAMINOPHEN 5-325 MG PO TABS
1.0000 | ORAL_TABLET | Freq: Four times a day (QID) | ORAL | Status: DC | PRN
  Administered 2016-05-23 – 2016-05-25 (×7): 1 via ORAL
  Filled 2016-05-22 (×7): qty 1

## 2016-05-22 NOTE — ED Triage Notes (Addendum)
Pt c/o swelling, redness in the LLE from the knee down with a hx of DVT .Marland Kitchen.pt also had an ulcer on the bottom of the left foot that still has a scabed area, that he had gone to the [facility] for tx and abx until a month ago when they thought it was healed.. There is noted redness and swelling to the foot..Marland Kitchen

## 2016-05-22 NOTE — ED Notes (Signed)
Pt

## 2016-05-22 NOTE — ED Provider Notes (Signed)
[facility] Emergency Department Provider Note  ____________________________________________   First MD Initiated Contact with Patient 05/22/16 1901     (approximate)  I have reviewed the triage vital signs and the nursing notes.   HISTORY  Chief Complaint Leg Pain  [name] is a 56 y.o. male who comes to the emergency department with 1 week of painful swelling to his left lower extremity. He has a history of DVT, diabetes mellitus insulin-dependent, atrial fibrillation, and hypertension. He is currently taking Eliquis for his DVT. Today he presented to an urgent care clinic advised him to come to the emergency department for further evaluation. He denies fevers or chills.   Past Medical History:  Diagnosis Date  . Atrial fibrillation (HCC)   . CHF (congestive heart failure) (HCC)   . Coronary artery disease   . Diabetes mellitus without complication (HCC)   . DVT (deep venous thrombosis) (HCC)   . Hypertension     There are no active problems to display for this patient.   History reviewed. No pertinent surgical history.  Prior to Admission medications   Medication Sig Start Date End Date Taking? Authorizing Provider  amiodarone (PACERONE) 200 MG tablet Take 200 mg by mouth 2 (two) times daily.    Historical Provider, MD  carvedilol (COREG) 3.125 MG tablet Take 1.562 mg by mouth 2 (two) times daily with a meal.    Historical Provider, MD  cephALEXin (KEFLEX) 500 MG capsule Take 1 capsule (500 mg total) by mouth 3 (three) times daily. 08/14/15   Ignacia Bayley, PA-C  digoxin (LANOXIN) 0.25 MG tablet Take 0.125 mg by mouth daily. Patient is taking differently than prescribed, only takes half a tablet    Historical Provider, MD  furosemide (LASIX) 40 MG tablet Take 40 mg by mouth daily.    Historical Provider, MD  insulin NPH-regular Human (NOVOLIN 70/30) (70-30) 100 UNIT/ML injection Inject 24 Units into the skin every morning.    Historical  Provider, MD  insulin NPH-regular Human (NOVOLIN 70/30) (70-30) 100 UNIT/ML injection Inject 22 Units into the skin every evening.    Historical Provider, MD  oxyCODONE-acetaminophen (ROXICET) 5-325 MG tablet Take 1 tablet by mouth every 6 (six) hours as needed. 08/14/15   Ignacia Bayley, PA-C  potassium chloride SA (K-DUR,KLOR-CON) 20 MEQ tablet Take 20 mEq by mouth daily.    Historical Provider, MD  sulfamethoxazole-trimethoprim (BACTRIM DS,SEPTRA DS) 800-160 MG tablet Take 1 tablet by mouth 2 (two) times daily. 06/01/15   Joni Reining, PA-C  traMADol (ULTRAM) 50 MG tablet Take 50 mg by mouth daily as needed.    Historical Provider, MD  warfarin (COUMADIN) 5 MG tablet Take 5 mg by mouth daily. Per pharmacy, direction says take as directed by provider    Historical Provider, MD    Allergies Lisinopril and Latex  No family history on file.  Social History Social History  Substance Use Topics  . Smoking status: Former Games developer  . Smokeless tobacco: Never Used  . Alcohol use Yes     Comment: occasionally    Review of Systems Constitutional: No fever/chills Eyes: No visual changes. ENT: No sore throat. Cardiovascular: Denies chest pain. Respiratory: Denies shortness of breath. Gastrointestinal: No abdominal pain.  No nausea, no vomiting.  No diarrhea.  No constipation. Genitourinary: Negative for dysuria. Musculoskeletal: Positive for foot pain and swelling. Skin: Negative for rash. Neurological: Negative for headaches, focal weakness or numbness.  10-point ROS otherwise negative.  ____________________________________________   PHYSICAL  EXAM:  VITAL SIGNS: ED Triage Vitals [05/22/16 1837]  Enc Vitals Group     BP (!) 131/100     Pulse Rate 90     Resp 18     Temp 99.2 F (37.3 C)     Temp Source Oral     SpO2 99 %     Weight 270 lb (122.5 kg)     Height 5\' 7"  (1.702 m)     Head Circumference      Peak Flow      Pain Score 3     Pain Loc      Pain Edu?      Excl. in  GC?     Constitutional: Alert and oriented x 4 well appearing nontoxic no diaphoresis speaks in full, clear sentences Eyes: PERRL EOMI. Head: Atraumatic. Nose: No congestion/rhinnorhea. Mouth/Throat: No trismus Neck: No stridor.   Cardiovascular: Irregularly irregular and tachycardic Grossly normal heart sounds.  Good peripheral circulation. Respiratory: Normal respiratory effort.  No retractions. Lungs CTAB and moving good air Gastrointestinal: Soft nondistended nontender no rebound no guarding no peritonitis no McBurney's tenderness negative Rovsing's no costovertebral tenderness negative Murphy's Musculoskeletal: Left lower extremity with 2+ pitting edema to mid shin left foot is swollen and erythematous and tender with an ulcer on the plantar aspect of his first metatarsal Neurologic:  Normal speech and language. No gross focal neurologic deficits are appreciated. Skin:  Ulcer to the plantar surface of foot Psychiatric: Mood and affect are normal. Speech and behavior are normal.  ____________________________________________   LABS (all labs ordered are listed, but only abnormal results are displayed)  Labs Reviewed  PROTIME-INR - Abnormal; Notable for the following:       Result Value   Prothrombin Time 16.6 (*)    All other components within normal limits  CULTURE, BLOOD (ROUTINE X 2)  CULTURE, BLOOD (ROUTINE X 2)  LACTIC ACID, PLASMA  LACTIC ACID, PLASMA  CBC WITH DIFFERENTIAL/PLATELET  URINALYSIS, ROUTINE W REFLEX MICROSCOPIC  COMPREHENSIVE METABOLIC PANEL  PROCALCITONIN  CREATININE, SERUM  CBC WITH DIFFERENTIAL/PLATELET   ____________________________________________  EKG   ____________________________________________  RADIOLOGY  X-rays consistent with osteomyelitis and septic joint ____________________________________________   PROCEDURES  Procedure(s) performed: no  Procedures  Critical Care performed:  no  ____________________________________________   INITIAL IMPRESSION / ASSESSMENT AND PLAN / ED COURSE  Pertinent labs & imaging results that were available during my care of the patient were reviewed by me and considered in my medical decision making (see chart for details).      ----------------------------------------- 7:52 PM on 05/22/2016 -----------------------------------------  X-ray shows osteomyelitis of the foot with septic arthropathy. I discussed the case with on-call podiatrist Dr. Orland Jarredroxler who agrees with IV antibiotics will kindly consult on the patient in the morning. _______________________  I discussed the case with the on-call hospitalist was graciously agreed to admit the patient to his service. _____________________   FINAL CLINICAL IMPRESSION(S) / ED DIAGNOSES  Final diagnoses:  Other acute osteomyelitis of left foot (HCC)  Septic arthritis of left foot, due to unspecified organism (HCC)  Permanent atrial fibrillation (HCC)  Sepsis, due to unspecified organism [facility](HCC)      NEW MEDICATIONS STARTED DURING THIS VISIT:  New Prescriptions   No medications on file     Note:  This document was prepared using Dragon voice recognition software and may include unintentional dictation errors.     Merrily BrittleNeil Hayzel Ruberg, MD 05/23/16 0005

## 2016-05-22 NOTE — Progress Notes (Signed)
Pharmacy Antibiotic Note  Ralph Meadows is a 56 y.o. male admitted on 05/22/2016 with cellulitis.  Pharmacy has been consulted for vancomycin dosing.  Plan: Patient got vanc 2g IV x 1 in ED. Vancomycin 1.25g IV every 8 hours.  Goal trough 10-15 mcg/mL.  Will draw a vanc trough 2/27 @ 1230 prior to 3rd dose to ensure patient is clearing drug. Ke 0.108 CrCl > 125 ml/min Half-life 6 hours ~ 8 hours  Height: 5\' 7"  (170.2 cm) Weight: 270 lb (122.5 kg) IBW/kg (Calculated) : 66.1  Temp (24hrs), Avg:98.8 F (37.1 C), Min:98.3 F (36.8 C), Max:99.2 F (37.3 C)   Recent Labs Lab 05/22/16 1923 05/22/16 1949  CREATININE  --  0.79  LATICACIDVEN 1.8  --     Estimated Creatinine Clearance: 129.4 mL/min (by C-G formula based on SCr of 0.79 mg/dL).    Allergies  Allergen Reactions  . Lisinopril Cough  . Latex Rash    Antimicrobials this admission: 2/26 zosyn >>    Dose adjustments this admission:   Microbiology results: 2/26 BCx: sent  Thank you for allowing pharmacy to be a part of this patient's care.  Thomasene Rippleavid Brinlee Gambrell, PharmD, BCPS Clinical Pharmacist 05/22/2016

## 2016-05-22 NOTE — Progress Notes (Signed)
Pharmacist - Prescriber Communication  Enoxaparin dose modified to 40 mg subcutaneously twice daily due to BMI greater than 40.  Joseff Luckman A. [place], [place].D., BCPS Clinical Pharmacist 05/22/2016 2339

## 2016-05-22 NOTE — H&P (Signed)
[facility] - [place] at [facility]   PATIENT NAME: Ralph Meadows    MR#:  [id number]  DATE OF BIRTH:  1960/12/05  DATE OF ADMISSION:  05/22/2016  PRIMARY CARE PHYSICIAN: No PCP Per Patient   REQUESTING/REFERRING PHYSICIAN: Rifenbark, MD  CHIEF COMPLAINT:   Chief Complaint  Patient presents with  . Leg Pain    HISTORY OF PRESENT ILLNESS:  Ralph Meadows  is a 56 y.o. male who presents with left foot pain.  Patient has diabetes and began to develop foot swelling over last day or two.  Xray here in ED shows osteomyelitis.  Podiatry called and will see patient in the morning.  Hospitalists called for admission  PAST MEDICAL HISTORY:   Past Medical History:  Diagnosis Date  . Atrial fibrillation (HCC)   . CHF (congestive heart failure) (HCC)   . Coronary artery disease   . Diabetes mellitus without complication (HCC)   . DVT (deep venous thrombosis) (HCC)   . Hypertension     PAST SURGICAL HISTORY:   Past Surgical History:  Procedure Laterality Date  . NO PAST SURGERIES      SOCIAL HISTORY:   Social History  Substance Use Topics  . Smoking status: Former Games developer  . Smokeless tobacco: Never Used  . Alcohol use Yes     Comment: occasionally    FAMILY HISTORY:   Family History  Problem Relation Age of Onset  . Hypertension Father   . Diabetes Maternal Grandfather   . Stroke Paternal Grandfather     DRUG ALLERGIES:   Allergies  Allergen Reactions  . Lisinopril Cough  . Latex Rash    MEDICATIONS AT HOME:   Prior to Admission medications   Medication Sig Start Date End Date Taking? Authorizing Provider  apixaban (ELIQUIS) 5 MG TABS tablet Take 5 mg by mouth 2 (two) times daily.   Yes Historical Provider, MD  atorvastatin (LIPITOR) 80 MG tablet Take 80 mg by mouth every evening.   Yes Historical Provider, MD  diltiazem (DILACOR XR) 240 MG 24 hr capsule Take 240 mg by mouth daily.   Yes Historical Provider, MD  insulin aspart  (NOVOLOG) 100 UNIT/ML injection Inject 12 Units into the skin 3 (three) times daily before meals.   Yes Historical Provider, MD  insulin glargine (LANTUS) 100 UNIT/ML injection Inject 36 Units into the skin at bedtime.   Yes Historical Provider, MD  losartan (COZAAR) 100 MG tablet Take 100 mg by mouth daily.   Yes Historical Provider, MD  metFORMIN (GLUCOPHAGE-XR) 500 MG 24 hr tablet Take 1,000 mg by mouth 2 (two) times daily.   Yes Historical Provider, MD  potassium chloride SA (K-DUR,KLOR-CON) 20 MEQ tablet Take 20 mEq by mouth daily.   Yes Historical Provider, MD  cephALEXin (KEFLEX) 500 MG capsule Take 1 capsule (500 mg total) by mouth 3 (three) times daily. Patient not taking: Reported on 05/22/2016 08/14/15   Ignacia Bayley, PA-C  oxyCODONE-acetaminophen (ROXICET) 5-325 MG tablet Take 1 tablet by mouth every 6 (six) hours as needed. Patient not taking: Reported on 05/22/2016 08/14/15   Ignacia Bayley, PA-C  sulfamethoxazole-trimethoprim (BACTRIM DS,SEPTRA DS) 800-160 MG tablet Take 1 tablet by mouth 2 (two) times daily. Patient not taking: Reported on 05/22/2016 06/01/15   Joni Reining, PA-C    REVIEW OF SYSTEMS:  Review of Systems  Constitutional: Negative for chills, fever, malaise/fatigue and weight loss.  HENT: Negative for ear pain, hearing loss and tinnitus.   Eyes: Negative for blurred  vision, double vision, pain and redness.  Respiratory: Negative for cough, hemoptysis and shortness of breath.   Cardiovascular: Positive for leg swelling (left lower extremity). Negative for chest pain, palpitations and orthopnea.  Gastrointestinal: Negative for abdominal pain, constipation, diarrhea, nausea and vomiting.  Genitourinary: Negative for dysuria, frequency and hematuria.  Musculoskeletal: Positive for joint pain (left foot). Negative for back pain and neck pain.  Skin:       No acne, rash, or lesions  Neurological: Negative for dizziness, tremors, focal weakness and weakness.   Endo/Heme/Allergies: Negative for polydipsia. Does not bruise/bleed easily.  Psychiatric/Behavioral: Negative for depression. The patient is not nervous/anxious and does not have insomnia.      VITAL SIGNS:   Vitals:   05/22/16 1930 05/22/16 1938 05/22/16 2000 05/22/16 2208  BP: 116/84  (!) 124/100 127/85  Pulse: (!) 28  (!) 109 87  Resp: (!) 24  15 18   Temp:  98.3 F (36.8 C)    TempSrc:  Oral    SpO2: (!) 62%  99% 95%  Weight:      Height:       Wt Readings from Last 3 Encounters:  05/22/16 122.5 kg (270 lb)  08/14/15 122.5 kg (270 lb)  06/19/15 122.5 kg (270 lb)    PHYSICAL EXAMINATION:  Physical Exam  Vitals reviewed. Constitutional: He is oriented to person, place, and time. He appears well-developed and well-nourished. No distress.  HENT:  Head: Normocephalic and atraumatic.  Mouth/Throat: Oropharynx is clear and moist.  Eyes: Conjunctivae and EOM are normal. Pupils are equal, round, and reactive to light. No scleral icterus.  Neck: Normal range of motion. Neck supple. No JVD present. No thyromegaly present.  Cardiovascular: Normal rate, regular rhythm and intact distal pulses.  Exam reveals no gallop and no friction rub.   No murmur heard. Respiratory: Effort normal and breath sounds normal. No respiratory distress. He has no wheezes. He has no rales.  GI: Soft. Bowel sounds are normal. He exhibits no distension. There is no tenderness.  Musculoskeletal: Normal range of motion. He exhibits edema (distal left lower extremity).  No arthritis, no gout  Lymphadenopathy:    He has no cervical adenopathy.  Neurological: He is alert and oriented to person, place, and time. No cranial nerve deficit.  No dysarthria, no aphasia  Skin: Skin is warm and dry. No rash noted. No erythema.  Psychiatric: He has a normal mood and affect. His behavior is normal. Judgment and thought content normal.    LABORATORY PANEL:   CBC  Recent Labs Lab 05/22/16 2005  WBC 8.8  HGB  10.9*  HCT 32.0*  PLT 286   ------------------------------------------------------------------------------------------------------------------  Chemistries   Recent Labs Lab 05/22/16 1949  NA 135  K 4.1  CL 101  CO2 25  GLUCOSE 225*  BUN 14  CREATININE 0.79  CALCIUM 9.0  AST 16  ALT 11*  ALKPHOS 74  BILITOT 0.7   ------------------------------------------------------------------------------------------------------------------  Cardiac Enzymes No results for input(s): TROPONINI in the last 168 hours. ------------------------------------------------------------------------------------------------------------------  RADIOLOGY:  Dg Ankle Complete Left  Result Date: 05/22/2016 CLINICAL DATA:  Left lower extremity redness and swelling. Plantar foot ulcer. Diabetic foot. EXAM: LEFT ANKLE COMPLETE - 3+ VIEW COMPARISON:  None. FINDINGS: Diffuse subcutaneous edema and skin thickening. Mild osteoarthritis of the ankle. Destructive changes of the first metatarsal are better assessed on concurrent foot radiograph. No bony destructive change or findings of osteomyelitis of the ankle. Multiple soft tissue phleboliths and vascular calcifications. No evidence of tibial  talar joint effusion. IMPRESSION: Soft tissue edema. No evidence of osteomyelitis of the ankle. First metatarsal changes described on dedicated foot radiograph performed concurrently. Electronically Signed   By: Rubye OaksMelanie  Ehinger M.D.   On: 05/22/2016 19:40   [place] Venous Img Lower Unilateral Left  Result Date: 05/22/2016 CLINICAL DATA:  Left leg erythema and swelling EXAM: LEFT LOWER EXTREMITY VENOUS DOPPLER ULTRASOUND TECHNIQUE: Gray-scale sonography with graded compression, as well as color Doppler and duplex ultrasound were performed to evaluate the lower extremity deep venous systems from the level of the common femoral vein and including the common femoral, femoral, profunda femoral, popliteal and calf veins including the  posterior tibial, peroneal and gastrocnemius veins when visible. The superficial great saphenous vein was also interrogated. Spectral Doppler was utilized to evaluate flow at rest and with distal augmentation maneuvers in the common femoral, femoral and popliteal veins. COMPARISON:  None. FINDINGS: Contralateral Common Femoral Vein: Respiratory phasicity is normal and symmetric with the symptomatic side. No evidence of thrombus. Normal compressibility. Common Femoral Vein: No evidence of thrombus. Normal compressibility, respiratory phasicity and response to augmentation. Saphenofemoral Junction: No evidence of thrombus. Normal compressibility and flow on color Doppler imaging. Profunda Femoral Vein: No evidence of thrombus. Normal compressibility and flow on color Doppler imaging. Femoral Vein: No evidence of thrombus. Normal compressibility, respiratory phasicity and response to augmentation. Popliteal Vein: No evidence of thrombus. Normal compressibility, respiratory phasicity and response to augmentation. Calf Veins: No evidence of thrombus. Normal compressibility and flow on color Doppler imaging. Superficial Great Saphenous Vein: No evidence of thrombus. Normal compressibility and flow on color Doppler imaging. Venous Reflux:  None. Other Findings: There are 2 lymph nodes noted in the left inguinal region measuring 3.7 x 1.6 x 1.3 cm and 3.2 x 1.6 x 2 cm. The latter is slightly more lateral in position and there is only a scant amount of hilar fat suggested. IMPRESSION: No evidence of deep venous thrombosis. Left inguinal lymphadenopathy the more lateral of which measures 3.2 x 1.6 x 2 cm and there is a suggestion of hilar fatty replacement. Percutaneous sampling may prove useful for further evaluation. Electronically Signed   By: Tollie Ethavid  Kwon M.D.   On: 05/22/2016 20:34   Dg Foot Complete Left  Result Date: 05/22/2016 CLINICAL DATA:  Left lower extremity redness and swelling. Plantar foot ulcer. Diabetic  foot. EXAM: LEFT FOOT - COMPLETE 3+ VIEW COMPARISON:  None. FINDINGS: Bony destructive change involves the distal first metatarsal extending to the metatarsal phalangeal joint. Prominent transverse lucency with lucencies extending to the articular surface seen and periarticular osseous fragments. Decreased density of the proximal first phalanx with bony destructive change and surrounding osseous fragments. There is associated skin and soft tissue thickening. Curvilinear densities in the soft tissues in the plantar foot may be calcifications or foreign bodies. No additional sites concerning for osteomyelitis. Diffuse soft tissue edema of the foot. Vascular calcifications are seen. IMPRESSION: Findings consistent with osteomyelitis and septic arthropathy of the first metatarsal phalangeal joint with bony destruction of the distal first metatarsal and proximal phalanx. Curvilinear densities in the subjacent plantar soft tissues may be calcifications or soft tissue foreign bodies. Electronically Signed   By: Rubye OaksMelanie  Ehinger M.D.   On: 05/22/2016 19:43    EKG:   Orders placed or performed in visit on 10/21/14  . EKG 12-Lead  . EKG 12-Lead  . EKG 12-Lead    IMPRESSION AND PLAN:  Principal Problem:   Osteomyelitis (HCC) - Broad spectrum antibiotics started, podiatry  consulted Active Problems:   HTN (hypertension) - continue home meds   Diabetes (HCC) - sliding scale insulin with corresponding glucose checks   CAD (coronary artery disease) - continue home meds   Paroxysmal atrial fibrillation (HCC) - continue home meds, hold anticoagulation tonight in anticipation of procedure  All the records are reviewed and case discussed with ED provider. Management plans discussed with the patient and/or family.  DVT PROPHYLAXIS: SubQ lovenox  GI PROPHYLAXIS: None  ADMISSION STATUS: Inpatient  CODE STATUS: Full Code Status History    This patient does not have a recorded code status. Please follow your  organizational policy for patients in this situation.      TOTAL TIME TAKING CARE OF THIS PATIENT: 45 minutes.    Shenell Rogalski FIELDING 05/22/2016, 10:38 PM  [facility]  Office  [phone]  CC: Primary care physician; No PCP Per Patient

## 2016-05-22 NOTE — Progress Notes (Signed)
Pharmacy Antibiotic Note  Ralph Meadows is a 56 y.o. male admitted on 05/22/2016 with osteomyelitis.  Pharmacy has been consulted for Zosyn dosing.  Plan: Zosyn 4.5 gm IV Q8H EI  Height: 5\' 7"  (170.2 cm) Weight: 270 lb (122.5 kg) IBW/kg (Calculated) : 66.1  Temp (24hrs), Avg:98.8 F (37.1 C), Min:98.3 F (36.8 C), Max:99.2 F (37.3 C)   Recent Labs Lab 05/22/16 1923 05/22/16 1949 05/22/16 2005  WBC  --   --  8.8  CREATININE  --  0.79  --   LATICACIDVEN 1.8  --   --     Estimated Creatinine Clearance: 129.4 mL/min (by C-G formula based on SCr of 0.79 mg/dL).    Allergies  Allergen Reactions  . Lisinopril Cough  . Latex Rash    Thank you for allowing pharmacy to be a part of this patient's care.  Carola FrostNathan A Idaly Verret, Pharm.D., BCPS Clinical Pharmacist 05/22/2016 11:40 PM

## 2016-05-23 DIAGNOSIS — I482 Chronic atrial fibrillation: Secondary | ICD-10-CM | POA: Diagnosis not present

## 2016-05-23 DIAGNOSIS — E11610 Type 2 diabetes mellitus with diabetic neuropathic arthropathy: Secondary | ICD-10-CM | POA: Diagnosis not present

## 2016-05-23 LAB — BASIC METABOLIC PANEL
ANION GAP: 6 (ref 5–15)
BUN: 16 mg/dL (ref 6–20)
CHLORIDE: 106 mmol/L (ref 101–111)
CO2: 24 mmol/L (ref 22–32)
Calcium: 8.5 mg/dL — ABNORMAL LOW (ref 8.9–10.3)
Creatinine, Ser: 0.84 mg/dL (ref 0.61–1.24)
GFR calc non Af Amer: >60 mL/min (ref >60)
Glucose, Bld: 236 mg/dL — ABNORMAL HIGH (ref 65–99)
POTASSIUM: 3.7 mmol/L (ref 3.5–5.1)
Sodium: 136 mmol/L (ref 135–145)

## 2016-05-23 LAB — URINALYSIS, ROUTINE W REFLEX MICROSCOPIC
Bilirubin Urine: NEGATIVE
GLUCOSE, UA: NEGATIVE mg/dL
Hgb urine dipstick: NEGATIVE
KETONES UR: NEGATIVE mg/dL
LEUKOCYTES UA: NEGATIVE
Nitrite: NEGATIVE
PH: 5 (ref 5.0–8.0)
PROTEIN: NEGATIVE mg/dL
Specific Gravity, Urine: 1.027 (ref 1.005–1.030)

## 2016-05-23 LAB — GLUCOSE, CAPILLARY
GLUCOSE-CAPILLARY: 236 mg/dL — AB (ref 65–99)
GLUCOSE-CAPILLARY: 182 mg/dL — AB (ref 65–99)
GLUCOSE-CAPILLARY: 234 mg/dL — AB (ref 65–99)
Glucose-Capillary: 228 mg/dL — ABNORMAL HIGH (ref 65–99)

## 2016-05-23 LAB — CBC
HCT: 29.1 % — ABNORMAL LOW (ref 40.0–52.0)
Hemoglobin: 9.9 g/dL — ABNORMAL LOW (ref 13.0–18.0)
MCH: 30.8 pg (ref 26.0–34.0)
MCHC: 34 g/dL (ref 32.0–36.0)
MCV: 90.7 fL (ref 80.0–100.0)
Platelets: 285 10*3/uL (ref 150–440)
RBC: 3.2 MIL/uL — AB (ref 4.40–5.90)
RDW: 14.7 % — AB (ref 11.5–14.5)
WBC: 8.1 10*3/uL (ref 3.8–10.6)

## 2016-05-23 LAB — VANCOMYCIN, TROUGH: Vancomycin Tr: 18 ug/mL (ref 15–20)

## 2016-05-23 MED ORDER — DEXTROSE 5 % IV SOLN
2.0000 g | Freq: Three times a day (TID) | INTRAVENOUS | Status: DC
  Administered 2016-05-23 – 2016-05-25 (×6): 2 g via INTRAVENOUS
  Filled 2016-05-23 (×7): qty 2

## 2016-05-23 MED ORDER — INSULIN ASPART 100 UNIT/ML ~~LOC~~ SOLN
0.0000 [IU] | Freq: Four times a day (QID) | SUBCUTANEOUS | Status: DC
  Administered 2016-05-23 (×3): 3 [IU] via SUBCUTANEOUS
  Administered 2016-05-23 – 2016-05-24 (×2): 2 [IU] via SUBCUTANEOUS
  Filled 2016-05-23 (×2): qty 3
  Filled 2016-05-23 (×2): qty 2
  Filled 2016-05-23: qty 3

## 2016-05-23 MED ORDER — INSULIN GLARGINE 100 UNIT/ML ~~LOC~~ SOLN
36.0000 [IU] | Freq: Every day | SUBCUTANEOUS | Status: DC
  Administered 2016-05-23 – 2016-05-25 (×3): 36 [IU] via SUBCUTANEOUS
  Filled 2016-05-23 (×4): qty 0.36

## 2016-05-23 NOTE — Progress Notes (Signed)
[employer] - [place] [facility]                                                                                                                                                                                  Patient Demographics   Ralph SoWilliam Meadows, is a 56 y.o. male, DOB - [birth year], OZH:[id number]  Admit date - 05/22/2016   Admitting Physician Oralia Manisavid Willis, MD  Outpatient Primary MD for the patient is No PCP Per Patient   LOS - 1  Subjective: Patient admitted with left foot swelling and pain. He denies other complaints including chest pain or shortness of breath    Review of Systems:   CONSTITUTIONAL: No documented fever. No fatigue, weakness. No weight gain, no weight loss.  EYES: No blurry or double vision.  ENT: No tinnitus. No postnasal drip. No redness of the oropharynx.  RESPIRATORY: No cough, no wheeze, no hemoptysis. No dyspnea.  CARDIOVASCULAR: No chest pain. No orthopnea. No palpitations. No syncope.  GASTROINTESTINAL: No nausea, no vomiting or diarrhea. No abdominal pain. No melena or hematochezia.  GENITOURINARY: No dysuria or hematuria.  ENDOCRINE: No polyuria or nocturia. No heat or cold intolerance.  HEMATOLOGY: No anemia. No bruising. No bleeding.  INTEGUMENTARY: No rashes. No lesions. Left foot swelling MUSCULOSKELETAL: No arthritis. No swelling. No gout.  NEUROLOGIC: No numbness, tingling, or ataxia. No seizure-type activity.  PSYCHIATRIC: No anxiety. No insomnia. No ADD.    Vitals:   Vitals:   05/23/16 0058 05/23/16 0445 05/23/16 0754 05/23/16 1202  BP:  119/84 122/87 (!) 124/91  Pulse:  (!) 114 (!) 113 (!) 105  Resp:  20 18 18   Temp:  98 F (36.7 C) 97.8 F (36.6 C) 98.5 F (36.9 C)  TempSrc:  Oral  Oral  SpO2: 99% 100% 100% 99%  Weight:      Height:        Wt Readings from Last 3 Encounters:  05/22/16 270 lb (122.5 kg)  08/14/15 270 lb (122.5 kg)  06/19/15 270 lb (122.5 kg)     Intake/Output Summary (Last 24 hours) at  05/23/16 1320 Last data filed at 05/23/16 0536  Gross per 24 hour  Intake           771.25 ml  Output                0 ml  Net           771.25 ml    Physical Exam:   GENERAL: Pleasant-appearing in no apparent distress.  HEAD, EYES, EARS, NOSE AND THROAT: Atraumatic, normocephalic. Extraocular muscles are intact. Pupils equal and reactive to light. Sclerae anicteric. No conjunctival injection. No oro-pharyngeal  erythema.  NECK: Supple. There is no jugular venous distention. No bruits, no lymphadenopathy, no thyromegaly.  HEART: Regular rate and rhythm,. No murmurs, no rubs, no clicks.  LUNGS: Clear to auscultation bilaterally. No rales or rhonchi. No wheezes.  ABDOMEN: Soft, flat, nontender, nondistended. Has good bowel sounds. No hepatosplenomegaly appreciated.  EXTREMITIES: Left foot swollen no open wound identified  NEUROLOGIC: The patient is alert, awake, and oriented x3 with no focal motor or sensory deficits appreciated bilaterally.  SKIN: Moist and warm with no rashes appreciated.  Psych: Not anxious, depressed LN: No inguinal LN enlargement    Antibiotics   Anti-infectives    Start     Dose/Rate Route Frequency Ordered Stop   05/23/16 2000  ceFEPIme (MAXIPIME) 2 g in dextrose 5 % 50 mL IVPB     2 g 100 mL/hr over 30 Minutes Intravenous Every 8 hours 05/23/16 1320     05/23/16 0500  vancomycin (VANCOCIN) 1,250 mg in sodium chloride 0.9 % 250 mL IVPB     1,250 mg 166.7 mL/hr over 90 Minutes Intravenous Every 8 hours 05/22/16 2029     05/23/16 0200  piperacillin-tazobactam (ZOSYN) IVPB 4.5 g  Status:  Discontinued     4.5 g 200 mL/hr over 30 Minutes Intravenous Every 8 hours 05/22/16 2328 05/23/16 1320   05/22/16 2000  vancomycin (VANCOCIN) 2,000 mg in sodium chloride 0.9 % 500 mL IVPB     2,000 mg 250 mL/hr over 120 Minutes Intravenous  Once 05/22/16 1952 05/22/16 2242   05/22/16 1915  piperacillin-tazobactam (ZOSYN) IVPB 3.375 g     3.375 g 100 mL/hr over 30 Minutes  Intravenous  Once 05/22/16 1914 05/22/16 1954      Medications   Scheduled Meds: . atorvastatin  80 mg Oral QPM  . ceFEPime (MAXIPIME) IV  2 g Intravenous Q8H  . diltiazem  240 mg Oral Daily  . enoxaparin (LOVENOX) injection  40 mg Subcutaneous BID  . insulin aspart  0-9 Units Subcutaneous Q6H  . losartan  100 mg Oral Daily  . vancomycin  1,250 mg Intravenous Q8H   Continuous Infusions: PRN Meds:.acetaminophen **OR** acetaminophen, ondansetron **OR** ondansetron (ZOFRAN) IV, oxyCODONE-acetaminophen   Data Review:   Micro Results Recent Results (from the past 240 hour(s))  Culture, blood (routine x 2)     Status: None (Preliminary result)   Collection Time: 05/22/16  7:04 PM  Result Value Ref Range Status   Specimen Description BLOOD RIGHT AC  Final   Special Requests BOTTLES DRAWN AEROBIC AND ANAEROBIC BCLV  Final   Culture NO GROWTH < 12 HOURS  Final   Report Status PENDING  Incomplete  Culture, blood (routine x 2)     Status: None (Preliminary result)   Collection Time: 05/22/16  7:13 PM  Result Value Ref Range Status   Specimen Description BLOOD RIGHT HAND  Final   Special Requests BOTTLES DRAWN AEROBIC AND ANAEROBIC BCLV  Final   Culture NO GROWTH < 12 HOURS  Final   Report Status PENDING  Incomplete    Radiology Reports Dg Ankle Complete Left  Result Date: 05/22/2016 CLINICAL DATA:  Left lower extremity redness and swelling. Plantar foot ulcer. Diabetic foot. EXAM: LEFT ANKLE COMPLETE - 3+ VIEW COMPARISON:  None. FINDINGS: Diffuse subcutaneous edema and skin thickening. Mild osteoarthritis of the ankle. Destructive changes of the first metatarsal are better assessed on concurrent foot radiograph. No bony destructive change or findings of osteomyelitis of the ankle. Multiple soft tissue phleboliths and vascular calcifications. No  evidence of tibial talar joint effusion. IMPRESSION: Soft tissue edema. No evidence of osteomyelitis of the ankle. First metatarsal changes  described on dedicated foot radiograph performed concurrently. Electronically Signed   By: Rubye Oaks M.D.   On: 05/22/2016 19:40   US Venous Img Lower Unilateral Left  Result Date: 05/22/2016 CLINICAL DATA:  Left leg erythema and swelling EXAM: LEFT LOWER EXTREMITY VENOUS DOPPLER ULTRASOUND TECHNIQUE: Gray-scale sonography with graded compression, as well as color Doppler and duplex ultrasound were performed to evaluate the lower extremity deep venous systems from the level of the common femoral vein and including the common femoral, femoral, profunda femoral, popliteal and calf veins including the posterior tibial, peroneal and gastrocnemius veins when visible. The superficial great saphenous vein was also interrogated. Spectral Doppler was utilized to evaluate flow at rest and with distal augmentation maneuvers in the common femoral, femoral and popliteal veins. COMPARISON:  None. FINDINGS: Contralateral Common Femoral Vein: Respiratory phasicity is normal and symmetric with the symptomatic side. No evidence of thrombus. Normal compressibility. Common Femoral Vein: No evidence of thrombus. Normal compressibility, respiratory phasicity and response to augmentation. Saphenofemoral Junction: No evidence of thrombus. Normal compressibility and flow on color Doppler imaging. Profunda Femoral Vein: No evidence of thrombus. Normal compressibility and flow on color Doppler imaging. Femoral Vein: No evidence of thrombus. Normal compressibility, respiratory phasicity and response to augmentation. Popliteal Vein: No evidence of thrombus. Normal compressibility, respiratory phasicity and response to augmentation. Calf Veins: No evidence of thrombus. Normal compressibility and flow on color Doppler imaging. Superficial Great Saphenous Vein: No evidence of thrombus. Normal compressibility and flow on color Doppler imaging. Venous Reflux:  None. Other Findings: There are 2 lymph nodes noted in the left inguinal region  measuring 3.7 x 1.6 x 1.3 cm and 3.2 x 1.6 x 2 cm. The latter is slightly more lateral in position and there is only a scant amount of hilar fat suggested. IMPRESSION: No evidence of deep venous thrombosis. Left inguinal lymphadenopathy the more lateral of which measures 3.2 x 1.6 x 2 cm and there is a suggestion of hilar fatty replacement. Percutaneous sampling may prove useful for further evaluation. Electronically Signed   By: Tollie Eth M.D.   On: 05/22/2016 20:34   Dg Foot Complete Left  Result Date: 05/22/2016 CLINICAL DATA:  Left lower extremity redness and swelling. Plantar foot ulcer. Diabetic foot. EXAM: LEFT FOOT - COMPLETE 3+ VIEW COMPARISON:  None. FINDINGS: Bony destructive change involves the distal first metatarsal extending to the metatarsal phalangeal joint. Prominent transverse lucency with lucencies extending to the articular surface seen and periarticular osseous fragments. Decreased density of the proximal first phalanx with bony destructive change and surrounding osseous fragments. There is associated skin and soft tissue thickening. Curvilinear densities in the soft tissues in the plantar foot may be calcifications or foreign bodies. No additional sites concerning for osteomyelitis. Diffuse soft tissue edema of the foot. Vascular calcifications are seen. IMPRESSION: Findings consistent with osteomyelitis and septic arthropathy of the first metatarsal phalangeal joint with bony destruction of the distal first metatarsal and proximal phalanx. Curvilinear densities in the subjacent plantar soft tissues may be calcifications or soft tissue foreign bodies. Electronically Signed   By: Rubye Oaks M.D.   On: 05/22/2016 19:43     CBC  Recent Labs Lab 05/22/16 2005 05/23/16 0358  WBC 8.8 8.1  HGB 10.9* 9.9*  HCT 32.0* 29.1*  PLT 286 285  MCV 90.3 90.7  MCH 30.7 30.8  MCHC 34.0 34.0  RDW 14.7* 14.7*  LYMPHSABS 2.2  --   MONOABS 0.8  --   EOSABS 0.0  --   BASOSABS 0.1  --      Chemistries   Recent Labs Lab 05/22/16 1949 05/23/16 0358  NA 135 136  K 4.1 3.7  CL 101 106  CO2 25 24  GLUCOSE 225* 236*  BUN 14 16  CREATININE 0.79 0.84  CALCIUM 9.0 8.5*  AST 16  --   ALT 11*  --   ALKPHOS 74  --   BILITOT 0.7  --    ------------------------------------------------------------------------------------------------------------------ estimated creatinine clearance is 123.2 mL/min (by C-G formula based on SCr of 0.84 mg/dL). ------------------------------------------------------------------------------------------------------------------ No results for input(s): HGBA1C in the last 72 hours. ------------------------------------------------------------------------------------------------------------------ No results for input(s): CHOL, HDL, LDLCALC, TRIG, CHOLHDL, LDLDIRECT in the last 72 hours. ------------------------------------------------------------------------------------------------------------------ No results for input(s): TSH, T4TOTAL, T3FREE, THYROIDAB in the last 72 hours.  Invalid input(s): FREET3 ------------------------------------------------------------------------------------------------------------------ No results for input(s): VITAMINB12, FOLATE, FERRITIN, TIBC, IRON, RETICCTPCT in the last 72 hours.  Coagulation profile  Recent Labs Lab 05/22/16 1904  INR 1.33    No results for input(s): DDIMER in the last 72 hours.  Cardiac Enzymes No results for input(s): CKMB, TROPONINI, MYOGLOBIN in the last 168 hours.  Invalid input(s): CK ------------------------------------------------------------------------------------------------------------------ Invalid input(s): POCBNP    Assessment & Plan  Patient is a 56 year old African-American male with diabetes presenting with foot swelling 1. Suspected Osteomyelitis (HCC) - Broad spectrum antibiotics , patient's symptoms could be due to due to  Charcot arthropathy, appreciated podiatry  input continue current antibiotics.  2.  HTN (hypertension) - continue Cardizem blood pressure stable  3.   Diabetes (HCC) -7 on sliding scale I will restart his Lantus   4.   CAD (coronary artery disease) - continue home meds   5.Paroxysmal atrial fibrillation (HCC) - continue home meds,  anticoagulation on hold, podiatry evaluation is being done if no procedure will restart anticoagulation tomorrow     Code Status Orders        Start     Ordered   05/22/16 2324  Full code  Continuous     05/22/16 2323    Code Status History    Date Active Date Inactive Code Status Order ID Comments User Context   This patient has a current code status but no historical code status.           Consults  podiatry  DVT Prophylaxis  SCDs     Lab Results  Component Value Date   PLT 285 05/23/2016     Time Spent in minutes   Greater than 50% of time spent in care coordination and counseling patient regarding the condition and plan of care.   Auburn Bilberry M.D on 05/23/2016 at 1:20 PM  Between 7am to 6pm - Pager - [phone]  After 6pm go to [email or web address] - password [email or web address] [facility]  [facility] [place] [facility]   Office  [phone]

## 2016-05-23 NOTE — Consult Note (Signed)
Patient Demographics  Ralph Meadows, is a 56 y.o. male   MRN: [id number]   DOB - 08/19/1960  Admit Date - 05/22/2016    Outpatient Primary MD for the patient is No PCP Per Patient  Consult requested in the Hospital by Auburn Bilberry, MD, On 05/23/2016    Reason for consult swelling to first metatarsal phalangeal joint. Admitted through the ER last night   With History of -  Past Medical History:  Diagnosis Date  . Atrial fibrillation (HCC)   . CHF (congestive heart failure) (HCC)   . Coronary artery disease   . Diabetes mellitus without complication (HCC)   . DVT (deep venous thrombosis) (HCC)   . Hypertension       Past Surgical History:  Procedure Laterality Date  . NO PAST SURGERIES      in for   Chief Complaint  Patient presents with  . Leg Pain     HPI  Ralph Meadows  is a 56 y.o. male, Patient states that over the last week or 2 he's noted some increased swelling to the left foot. Has had a history of the ulcer on that foot but has not had one currently. His been followed by the [place]. Was seen in emergency room last night and admitted for concerns regarding possible osteomyelitis to that foot.    Review of Systems    In addition to the HPI above,  No Fever-chills, No Headache, No changes with Vision or hearing, No problems swallowing food or Liquids, No Chest pain, Cough or Shortness of Breath, No Abdominal pain, No Nausea or Vommitting, Bowel movements are regular, No Blood in stool or Urine, No dysuria, No new skin rashes or bruises, No new joints pains-aches,  No new weakness, tingling, numbness in any extremity, No recent weight gain or loss, No polyuria, polydypsia or polyphagia, No significant Mental Stressors.  A full 10 point Review of Systems was done, except as  stated above, all other Review of Systems were negative.   Social History Social History  Substance Use Topics  . Smoking status: Former Games developer  . Smokeless tobacco: Never Used  . Alcohol use Yes     Comment: occasionally    Family History Family History  Problem Relation Age of Onset  . Hypertension Father   . Diabetes Maternal Grandfather   . Stroke Paternal Grandfather      Prior to Admission medications   Medication Sig Start Date End Date Taking? Authorizing Provider  apixaban (ELIQUIS) 5 MG TABS tablet Take 5 mg by mouth 2 (two) times daily.   Yes Historical Provider, MD  atorvastatin (LIPITOR) 80 MG tablet Take 80 mg by mouth every evening.   Yes Historical Provider, MD  diltiazem (DILACOR XR) 240 MG 24 hr capsule Take 240 mg by mouth daily.   Yes Historical Provider, MD  insulin aspart (NOVOLOG) 100 UNIT/ML injection Inject 12 Units into the skin 3 (three) times daily before meals.   Yes Historical Provider, MD  insulin glargine (LANTUS) 100 UNIT/ML injection Inject 36 Units into the skin at bedtime.   Yes Historical Provider, MD  losartan (COZAAR) 100 MG tablet Take 100 mg by mouth daily.  Yes Historical Provider, MD  metFORMIN (GLUCOPHAGE-XR) 500 MG 24 hr tablet Take 1,000 mg by mouth 2 (two) times daily.   Yes Historical Provider, MD  potassium chloride SA (K-DUR,KLOR-CON) 20 MEQ tablet Take 20 mEq by mouth daily.   Yes Historical Provider, MD  cephALEXin (KEFLEX) 500 MG capsule Take 1 capsule (500 mg total) by mouth 3 (three) times daily. Patient not taking: Reported on 05/22/2016 08/14/15   Ignacia Bayley, PA-C  oxyCODONE-acetaminophen (ROXICET) 5-325 MG tablet Take 1 tablet by mouth every 6 (six) hours as needed. Patient not taking: Reported on 05/22/2016 08/14/15   Ignacia Bayley, PA-C  sulfamethoxazole-trimethoprim (BACTRIM DS,SEPTRA DS) 800-160 MG tablet Take 1 tablet by mouth 2 (two) times daily. Patient not taking: Reported on 05/22/2016 06/01/15   Joni Reining, PA-C     Anti-infectives    Start     Dose/Rate Route Frequency Ordered Stop   05/23/16 0500  vancomycin (VANCOCIN) 1,250 mg in sodium chloride 0.9 % 250 mL IVPB     1,250 mg 166.7 mL/hr over 90 Minutes Intravenous Every 8 hours 05/22/16 2029     05/23/16 0200  piperacillin-tazobactam (ZOSYN) IVPB 4.5 g     4.5 g 200 mL/hr over 30 Minutes Intravenous Every 8 hours 05/22/16 2328     05/22/16 2000  vancomycin (VANCOCIN) 2,000 mg in sodium chloride 0.9 % 500 mL IVPB     2,000 mg 250 mL/hr over 120 Minutes Intravenous  Once 05/22/16 1952 05/22/16 2242   05/22/16 1915  piperacillin-tazobactam (ZOSYN) IVPB 3.375 g     3.375 g 100 mL/hr over 30 Minutes Intravenous  Once 05/22/16 1914 05/22/16 1954      Scheduled Meds: . atorvastatin  80 mg Oral QPM  . diltiazem  240 mg Oral Daily  . enoxaparin (LOVENOX) injection  40 mg Subcutaneous BID  . insulin aspart  0-9 Units Subcutaneous Q6H  . losartan  100 mg Oral Daily  . piperacillin-tazobactam (ZOSYN)  IV  4.5 g Intravenous Q8H  . vancomycin  1,250 mg Intravenous Q8H   Continuous Infusions: PRN Meds:.acetaminophen **OR** acetaminophen, ondansetron **OR** ondansetron (ZOFRAN) IV, oxyCODONE-acetaminophen  Allergies  Allergen Reactions  . Lisinopril Cough  . Latex Rash    Physical Exam  Vitals  Blood pressure (!) 124/91, pulse (!) 105, temperature 98.5 F (36.9 C), temperature source Oral, resp. rate 18, height 5\' 7"  (1.702 m), weight 122.5 kg (270 lb), SpO2 99 %.  Lower Extremity exam:  Vascular:DP and PT pulses are palpable bilaterally  Dermatological: Patient is a hyperkeratotic lesion plantar to the left first metatarsal head. Debridement of this area with a 15 scalpel blade and removed hyperkeratotic tissue but there is no underlying ulceration present in the area.  Neurological: Patient has significant diabetic peripheral neuropathy. Likely an autonomic type neuropathy.  Ortho: Patient has severe swelling to the left first  metatarsophalangeal joint. There is a palpable fluctuance of fluid underneath first metatarsal head as well. Trays were reviewed and showed extensive destruction to the first metatarsal head but there is also evidence of old periosteal bone healing around the base of the proximal phalanx and the first metatarsal head. There is comminution of the metatarsal head and lucency between the capital fragment and the shaft. There is a result of the fluctuance sterilely prepped the medial aspect of the plantar first metatarsal head area and used an 18-gauge needle with a 50 cc syringe to withdraw approximately 10 cc of bloody fluid from the region. Addition that I expressed out another  5 cc of bloody fluid from the region. This does not appear to have any purulence at all. Some of this fluid was sent for culture and evaluation.  Data Review  CBC  Recent Labs Lab 05/22/16 2005 05/23/16 0358  WBC 8.8 8.1  HGB 10.9* 9.9*  HCT 32.0* 29.1*  PLT 286 285  MCV 90.3 90.7  MCH 30.7 30.8  MCHC 34.0 34.0  RDW 14.7* 14.7*  LYMPHSABS 2.2  --   MONOABS 0.8  --   EOSABS 0.0  --   BASOSABS 0.1  --    ------------------------------------------------------------------------------------------------------------------  Chemistries   Recent Labs Lab 05/22/16 1949 05/23/16 0358  NA 135 136  K 4.1 3.7  CL 101 106  CO2 25 24  GLUCOSE 225* 236*  BUN 14 16  CREATININE 0.79 0.84  CALCIUM 9.0 8.5*  AST 16  --   ALT 11*  --   ALKPHOS 74  --   BILITOT 0.7  --    ------------------------------------------------------------------------------------------------------------------ estimated creatinine clearance is 123.2 mL/min (by C-G formula based on SCr of 0.84 mg/dL). --------  Imaging results:   Dg Ankle Complete Left  Result Date: 05/22/2016 CLINICAL DATA:  Left lower extremity redness and swelling. Plantar foot ulcer. Diabetic foot. EXAM: LEFT ANKLE COMPLETE - 3+ VIEW COMPARISON:  None. FINDINGS: Diffuse  subcutaneous edema and skin thickening. Mild osteoarthritis of the ankle. Destructive changes of the first metatarsal are better assessed on concurrent foot radiograph. No bony destructive change or findings of osteomyelitis of the ankle. Multiple soft tissue phleboliths and vascular calcifications. No evidence of tibial talar joint effusion. IMPRESSION: Soft tissue edema. No evidence of osteomyelitis of the ankle. First metatarsal changes described on dedicated foot radiograph performed concurrently. Electronically Signed   By: Rubye Oaks M.D.   On: 05/22/2016 19:40   US Venous Img Lower Unilateral Left  Result Date: 05/22/2016 CLINICAL DATA:  Left leg erythema and swelling EXAM: LEFT LOWER EXTREMITY VENOUS DOPPLER ULTRASOUND TECHNIQUE: Gray-scale sonography with graded compression, as well as color Doppler and duplex ultrasound were performed to evaluate the lower extremity deep venous systems from the level of the common femoral vein and including the common femoral, femoral, profunda femoral, popliteal and calf veins including the posterior tibial, peroneal and gastrocnemius veins when visible. The superficial great saphenous vein was also interrogated. Spectral Doppler was utilized to evaluate flow at rest and with distal augmentation maneuvers in the common femoral, femoral and popliteal veins. COMPARISON:  None. FINDINGS: Contralateral Common Femoral Vein: Respiratory phasicity is normal and symmetric with the symptomatic side. No evidence of thrombus. Normal compressibility. Common Femoral Vein: No evidence of thrombus. Normal compressibility, respiratory phasicity and response to augmentation. Saphenofemoral Junction: No evidence of thrombus. Normal compressibility and flow on color Doppler imaging. Profunda Femoral Vein: No evidence of thrombus. Normal compressibility and flow on color Doppler imaging. Femoral Vein: No evidence of thrombus. Normal compressibility, respiratory phasicity and  response to augmentation. Popliteal Vein: No evidence of thrombus. Normal compressibility, respiratory phasicity and response to augmentation. Calf Veins: No evidence of thrombus. Normal compressibility and flow on color Doppler imaging. Superficial Great Saphenous Vein: No evidence of thrombus. Normal compressibility and flow on color Doppler imaging. Venous Reflux:  None. Other Findings: There are 2 lymph nodes noted in the left inguinal region measuring 3.7 x 1.6 x 1.3 cm and 3.2 x 1.6 x 2 cm. The latter is slightly more lateral in position and there is only a scant amount of hilar fat suggested. IMPRESSION: No evidence  of deep venous thrombosis. Left inguinal lymphadenopathy the more lateral of which measures 3.2 x 1.6 x 2 cm and there is a suggestion of hilar fatty replacement. Percutaneous sampling may prove useful for further evaluation. Electronically Signed   By: Tollie Ethavid  Kwon M.D.   On: 05/22/2016 20:34   Dg Foot Complete Left  Result Date: 05/22/2016 CLINICAL DATA:  Left lower extremity redness and swelling. Plantar foot ulcer. Diabetic foot. EXAM: LEFT FOOT - COMPLETE 3+ VIEW COMPARISON:  None. FINDINGS: Bony destructive change involves the distal first metatarsal extending to the metatarsal phalangeal joint. Prominent transverse lucency with lucencies extending to the articular surface seen and periarticular osseous fragments. Decreased density of the proximal first phalanx with bony destructive change and surrounding osseous fragments. There is associated skin and soft tissue thickening. Curvilinear densities in the soft tissues in the plantar foot may be calcifications or foreign bodies. No additional sites concerning for osteomyelitis. Diffuse soft tissue edema of the foot. Vascular calcifications are seen. IMPRESSION: Findings consistent with osteomyelitis and septic arthropathy of the first metatarsal phalangeal joint with bony destruction of the distal first metatarsal and proximal phalanx.  Curvilinear densities in the subjacent plantar soft tissues may be calcifications or soft tissue foreign bodies. Electronically Signed   By: Rubye OaksMelanie  Ehinger M.D.   On: 05/22/2016 19:43     Assessment & Plan: Based on the lack of an open wound and the bloody appearance to the fluid removed from the first metatarsal head area of the like Charcot arthropathy is most likely diagnosis with significant fractures and bleeding from first metatarsal head injury. There is still a possibility of osteomyelitis but feel more strongly that this is a Charcot type injury to that joint. I will await the cultures from the fluid removed from the joint and also asked for a Gram stain. I'll put a compressive dressing around the foot and also keep him nonweightbearing for today and likely for the foreseeable future until his has chance to heal. I will get physical therapy likely to start working with him tomorrow for nonweightbearing on the left foot. Also like radiology to take another look at the x-rays and include Charcot arthropathy with her differential diagnosis.  Principal Problem:   Osteomyelitis (HCC) Active Problems:   HTN (hypertension)   Diabetes (HCC)   CAD (coronary artery disease)   Paroxysmal atrial fibrillation (HCC)     Family Communication: Plan discussed with patient and family   Epimenio SarinROXLER,Logann Whitebread G M.D on 05/23/2016 at 1:11 PM  Thank you for the consult, we will follow the patient with you in the Hospital.

## 2016-05-23 NOTE — Care Management (Signed)
[facility]ed to offer [facility] and patient ask that I come back ater after he has spoken with the attending regarding what his medical plans are. RNCM to follow up.

## 2016-05-23 NOTE — Progress Notes (Signed)
Pharmacy Antibiotic Note  Ralph Meadows is a 56 y.o. male admitted on 05/22/2016 with cellulitis.  Pharmacy has been consulted for vancomycin dosing.  Plan: 2/27 2016 vancomycin trough therapeutic before fourth dose. Continue current regimen. Pharmacy will continue to follow and adjust as needed to maintain trough 15 to 20 mcg/mL for suspected osteomyelitis.  Patient got vanc 2g IV x 1 in ED. Vancomycin 1.25g IV every 8 hours.  Goal trough 10-15 mcg/mL.  Will draw a vanc trough 2/27 @ 1230 prior to 3rd dose to ensure patient is clearing drug. Ke 0.108 CrCl > 125 ml/min Half-life 6 hours ~ 8 hours  Height: 5\' 7"  (170.2 cm) Weight: 270 lb (122.5 kg) IBW/kg (Calculated) : 66.1  Temp (24hrs), Avg:98.4 F (36.9 C), Min:97.8 F (36.6 C), Max:98.9 F (37.2 C)   Recent Labs Lab 05/22/16 1923 05/22/16 1949 05/22/16 2005 05/23/16 0358 05/23/16 2016  WBC  --   --  8.8 8.1  --   CREATININE  --  0.79  --  0.84  --   LATICACIDVEN 1.8  --   --   --   --   VANCOTROUGH  --   --   --   --  18    Estimated Creatinine Clearance: 123.2 mL/min (by C-G formula based on SCr of 0.84 mg/dL).    Allergies  Allergen Reactions  . Lisinopril Cough  . Latex Rash    Antimicrobials this admission: 2/26 zosyn >>    Dose adjustments this admission:   Microbiology results: 2/26 BCx: sent  Thank you for allowing pharmacy to be a part of this patient's care.  Marea Reasner A. Dahlia Bailiffookson, PharmD, BCPS Clinical Pharmacist 05/23/2016

## 2016-05-23 NOTE — Care Management (Signed)
[facility] Case discussed with Laveda AbbeLorie and it appears patient may be discharged within the next 24 hours after PT evaluation. Would not benefit from transfer at this point. Faxed Lorie [facility] to [phone]. She [facility] if status changes.

## 2016-05-23 NOTE — Progress Notes (Signed)
Inpatient Diabetes Program Recommendations  AACE/ADA: New Consensus Statement on Inpatient Glycemic Control (2015)  Target Ranges:  Prepandial:   less than 140 mg/dL      Peak postprandial:   less than 180 mg/dL (1-2 hours)      Critically ill patients:  140 - 180 mg/dL   Lab Results  Component Value Date   GLUCAP 236 (H) 05/23/2016   HGBA1C 14.5 (H) 02/17/2012    Review of Glycemic Control  Results for Ralph Meadows, Ralph Meadows (MRN [id number]) as of 05/23/2016 11:16  Ref. Range 05/22/2016 23:52 05/23/2016 05:10  Glucose-Capillary Latest Ref Range: 65 - 99 mg/dL 782176 (H) 956236 (H)    Diabetes history: Type 2, A1C pending Outpatient Diabetes medications: Novolog 12 units tid, Lantus 36 units qhs, Metformin 1000mg  bid Current orders for Inpatient glycemic control: Novolog 0-9 units q6h  Inpatient Diabetes Program Recommendations:  Consider ordering a portion of the patient's basal insulin since fasting blood sugar 236mg /dl. Consider Lantus 18 units qhs starting tonight.  Susette RacerJulie Rielyn Krupinski, RN, BA, MHA, CDE Diabetes Coordinator Inpatient Diabetes Program  [phone] (Team Pager) [phone] [facility] Office) 05/23/2016 11:20 AM

## 2016-05-24 DIAGNOSIS — E11610 Type 2 diabetes mellitus with diabetic neuropathic arthropathy: Secondary | ICD-10-CM | POA: Diagnosis not present

## 2016-05-24 DIAGNOSIS — I482 Chronic atrial fibrillation: Secondary | ICD-10-CM | POA: Diagnosis not present

## 2016-05-24 LAB — BASIC METABOLIC PANEL
Anion gap: 7 (ref 5–15)
BUN: 14 mg/dL (ref 6–20)
CO2: 24 mmol/L (ref 22–32)
CREATININE: 0.82 mg/dL (ref 0.61–1.24)
Calcium: 8.5 mg/dL — ABNORMAL LOW (ref 8.9–10.3)
Chloride: 107 mmol/L (ref 101–111)
GFR calc Af Amer: >60 mL/min (ref >60)
Glucose, Bld: 151 mg/dL — ABNORMAL HIGH (ref 65–99)
Potassium: 3.6 mmol/L (ref 3.5–5.1)
SODIUM: 138 mmol/L (ref 135–145)

## 2016-05-24 LAB — GLUCOSE, CAPILLARY
GLUCOSE-CAPILLARY: 142 mg/dL — AB (ref 65–99)
GLUCOSE-CAPILLARY: 290 mg/dL — AB (ref 65–99)
Glucose-Capillary: 194 mg/dL — ABNORMAL HIGH (ref 65–99)

## 2016-05-24 LAB — CBC
HCT: 28.8 % — ABNORMAL LOW (ref 40.0–52.0)
Hemoglobin: 9.8 g/dL — ABNORMAL LOW (ref 13.0–18.0)
MCH: 30.7 pg (ref 26.0–34.0)
MCHC: 34.1 g/dL (ref 32.0–36.0)
MCV: 90 fL (ref 80.0–100.0)
PLATELETS: 318 10*3/uL (ref 150–440)
RBC: 3.2 MIL/uL — ABNORMAL LOW (ref 4.40–5.90)
RDW: 14.8 % — AB (ref 11.5–14.5)
WBC: 6.6 10*3/uL (ref 3.8–10.6)

## 2016-05-24 LAB — HEMOGLOBIN A1C
HEMOGLOBIN A1C: 8.4 % — AB (ref 4.8–5.6)
MEAN PLASMA GLUCOSE: 194 mg/dL

## 2016-05-24 LAB — VANCOMYCIN, TROUGH: Vancomycin Tr: 21 ug/mL (ref 15–20)

## 2016-05-24 MED ORDER — VANCOMYCIN HCL 10 G IV SOLR
1500.0000 mg | Freq: Two times a day (BID) | INTRAVENOUS | Status: DC
  Administered 2016-05-24 – 2016-05-25 (×2): 1500 mg via INTRAVENOUS
  Filled 2016-05-24 (×4): qty 1500

## 2016-05-24 MED ORDER — INSULIN ASPART 100 UNIT/ML ~~LOC~~ SOLN
0.0000 [IU] | Freq: Three times a day (TID) | SUBCUTANEOUS | Status: DC
  Administered 2016-05-24: 5 [IU] via SUBCUTANEOUS
  Administered 2016-05-25 (×3): 2 [IU] via SUBCUTANEOUS
  Administered 2016-05-26: 3 [IU] via SUBCUTANEOUS
  Filled 2016-05-24: qty 3
  Filled 2016-05-24 (×2): qty 2
  Filled 2016-05-24: qty 5
  Filled 2016-05-24: qty 2

## 2016-05-24 MED ORDER — METOPROLOL TARTRATE 50 MG PO TABS
50.0000 mg | ORAL_TABLET | Freq: Two times a day (BID) | ORAL | Status: DC
  Administered 2016-05-24 – 2016-05-25 (×4): 50 mg via ORAL
  Filled 2016-05-24 (×4): qty 1

## 2016-05-24 MED ORDER — DILTIAZEM HCL 90 MG PO TABS
90.0000 mg | ORAL_TABLET | Freq: Four times a day (QID) | ORAL | Status: DC
  Administered 2016-05-24 – 2016-05-26 (×6): 90 mg via ORAL
  Filled 2016-05-24 (×8): qty 1

## 2016-05-24 MED ORDER — DIGOXIN 0.25 MG/ML IJ SOLN
0.2500 mg | Freq: Once | INTRAMUSCULAR | Status: AC
  Administered 2016-05-24: 0.25 mg via INTRAVENOUS
  Filled 2016-05-24: qty 1

## 2016-05-24 NOTE — Progress Notes (Signed)
[facility] [place] [facility]                                                                                                                                                                                  Patient Demographics   Ralph SoWilliam Petitti, is a 56 y.o. male, DOB - [birth year], ZOX:[id number]  Admit date - 05/22/2016   Admitting Physician Oralia Manisavid Willis, MD  Outpatient Primary MD for the patient is No PCP Per Patient   LOS - 2  Subjective: Patient noted to have heart rate that was elevated earlier today patient complains of feeling weak and dizzy no chest pain  Review of Systems:   CONSTITUTIONAL: No documented fever. No fatigue, weakness. No weight gain, no weight loss.  EYES: No blurry or double vision.  ENT: No tinnitus. No postnasal drip. No redness of the oropharynx.  RESPIRATORY: No cough, no wheeze, no hemoptysis. No dyspnea.  CARDIOVASCULAR: No chest pain. No orthopnea. No palpitations. No syncope.  GASTROINTESTINAL: No nausea, no vomiting or diarrhea. No abdominal pain. No melena or hematochezia.  GENITOURINARY: No dysuria or hematuria.  ENDOCRINE: No polyuria or nocturia. No heat or cold intolerance.  HEMATOLOGY: No anemia. No bruising. No bleeding.  INTEGUMENTARY: No rashes. No lesions. Left foot swelling MUSCULOSKELETAL: No arthritis. No swelling. No gout.  NEUROLOGIC: No numbness, tingling, or ataxia. No seizure-type activity.  PSYCHIATRIC: No anxiety. No insomnia. No ADD.    Vitals:   Vitals:   05/23/16 1930 05/24/16 0325 05/24/16 0748 05/24/16 1130  BP: (!) 111/49 135/83 134/67 123/75  Pulse: (!) 105 (!) 120 (!) 106 100  Resp: 19 19 18    Temp: 98.9 F (37.2 C) 99.3 F (37.4 C) 98.2 F (36.8 C) 99.2 F (37.3 C)  TempSrc: Oral Oral Oral Oral  SpO2: 99% 98% 97% 100%  Weight:      Height:        Wt Readings from Last 3 Encounters:  05/22/16 270 lb (122.5 kg)  08/14/15 270 lb (122.5 kg)  06/19/15 270 lb (122.5 kg)      Intake/Output Summary (Last 24 hours) at 05/24/16 1217 Last data filed at 05/24/16 0836  Gross per 24 hour  Intake              100 ml  Output             1350 ml  Net            -1250 ml    Physical Exam:   GENERAL: Pleasant-appearing in no apparent distress.  HEAD, EYES, EARS, NOSE AND THROAT: Atraumatic, normocephalic. Extraocular muscles are intact. Pupils equal and reactive to light. Sclerae anicteric. No  conjunctival injection. No oro-pharyngeal erythema.  NECK: Supple. There is no jugular venous distention. No bruits, no lymphadenopathy, no thyromegaly.  HEART: Irregularly irregular. No murmurs, no rubs, no clicks.  LUNGS: Clear to auscultation bilaterally. No rales or rhonchi. No wheezes.  ABDOMEN: Soft, flat, nontender, nondistended. Has good bowel sounds. No hepatosplenomegaly appreciated.  EXTREMITIES: Left foot swollen no open wound identified  NEUROLOGIC: The patient is alert, awake, and oriented x3 with no focal motor or sensory deficits appreciated bilaterally.  SKIN: Moist and warm with no rashes appreciated.  Psych: Not anxious, depressed LN: No inguinal LN enlargement    Antibiotics   Anti-infectives    Start     Dose/Rate Route Frequency Ordered Stop   05/23/16 2000  ceFEPIme (MAXIPIME) 2 g in dextrose 5 % 50 mL IVPB     2 g 100 mL/hr over 30 Minutes Intravenous Every 8 hours 05/23/16 1320     05/23/16 0500  vancomycin (VANCOCIN) 1,250 mg in sodium chloride 0.9 % 250 mL IVPB     1,250 mg 166.7 mL/hr over 90 Minutes Intravenous Every 8 hours 05/22/16 2029     05/23/16 0200  piperacillin-tazobactam (ZOSYN) IVPB 4.5 g  Status:  Discontinued     4.5 g 200 mL/hr over 30 Minutes Intravenous Every 8 hours 05/22/16 2328 05/23/16 1320   05/22/16 2000  vancomycin (VANCOCIN) 2,000 mg in sodium chloride 0.9 % 500 mL IVPB     2,000 mg 250 mL/hr over 120 Minutes Intravenous  Once 05/22/16 1952 05/22/16 2242   05/22/16 1915  piperacillin-tazobactam (ZOSYN) IVPB  3.375 g     3.375 g 100 mL/hr over 30 Minutes Intravenous  Once 05/22/16 1914 05/22/16 1954      Medications   Scheduled Meds: . atorvastatin  80 mg Oral QPM  . ceFEPime (MAXIPIME) IV  2 g Intravenous Q8H  . diltiazem  90 mg Oral Q6H  . enoxaparin (LOVENOX) injection  40 mg Subcutaneous BID  . insulin aspart  0-9 Units Subcutaneous Q6H  . insulin glargine  36 Units Subcutaneous QHS  . metoprolol tartrate  50 mg Oral BID  . vancomycin  1,250 mg Intravenous Q8H   Continuous Infusions: PRN Meds:.acetaminophen **OR** acetaminophen, ondansetron **OR** ondansetron (ZOFRAN) IV, oxyCODONE-acetaminophen   Data Review:   Micro Results Recent Results (from the past 240 hour(s))  Culture, blood (routine x 2)     Status: None (Preliminary result)   Collection Time: 05/22/16  7:04 PM  Result Value Ref Range Status   Specimen Description BLOOD RIGHT AC  Final   Special Requests BOTTLES DRAWN AEROBIC AND ANAEROBIC BCLV  Final   Culture NO GROWTH 2 DAYS  Final   Report Status PENDING  Incomplete  Culture, blood (routine x 2)     Status: None (Preliminary result)   Collection Time: 05/22/16  7:13 PM  Result Value Ref Range Status   Specimen Description BLOOD RIGHT HAND  Final   Special Requests BOTTLES DRAWN AEROBIC AND ANAEROBIC BCLV  Final   Culture NO GROWTH 2 DAYS  Final   Report Status PENDING  Incomplete  Aerobic Culture (superficial specimen)     Status: None (Preliminary result)   Collection Time: 05/23/16  1:30 PM  Result Value Ref Range Status   Specimen Description FOOT LEFT FOOT DR COLLECTED SAMPLE  Final   Special Requests NONE  Final   Gram Stain   Final    ABUNDANT WBC PRESENT,BOTH PMN AND MONONUCLEAR RARE GRAM POSITIVE COCCI IN PAIRS Performed at [facility]  [facility], [address], [place], [place] [address]    Culture PENDING  Incomplete   Report Status PENDING  Incomplete    Radiology Reports Dg Ankle Complete Left  Result Date: 05/22/2016 CLINICAL DATA:   Left lower extremity redness and swelling. Plantar foot ulcer. Diabetic foot. EXAM: LEFT ANKLE COMPLETE - 3+ VIEW COMPARISON:  None. FINDINGS: Diffuse subcutaneous edema and skin thickening. Mild osteoarthritis of the ankle. Destructive changes of the first metatarsal are better assessed on concurrent foot radiograph. No bony destructive change or findings of osteomyelitis of the ankle. Multiple soft tissue phleboliths and vascular calcifications. No evidence of tibial talar joint effusion. IMPRESSION: Soft tissue edema. No evidence of osteomyelitis of the ankle. First metatarsal changes described on dedicated foot radiograph performed concurrently. Electronically Signed   By: Rubye Oaks M.D.   On: 05/22/2016 19:40   US Venous Img Lower Unilateral Left  Result Date: 05/22/2016 CLINICAL DATA:  Left leg erythema and swelling EXAM: LEFT LOWER EXTREMITY VENOUS DOPPLER ULTRASOUND TECHNIQUE: Gray-scale sonography with graded compression, as well as color Doppler and duplex ultrasound were performed to evaluate the lower extremity deep venous systems from the level of the common femoral vein and including the common femoral, femoral, profunda femoral, popliteal and calf veins including the posterior tibial, peroneal and gastrocnemius veins when visible. The superficial great saphenous vein was also interrogated. Spectral Doppler was utilized to evaluate flow at rest and with distal augmentation maneuvers in the common femoral, femoral and popliteal veins. COMPARISON:  None. FINDINGS: Contralateral Common Femoral Vein: Respiratory phasicity is normal and symmetric with the symptomatic side. No evidence of thrombus. Normal compressibility. Common Femoral Vein: No evidence of thrombus. Normal compressibility, respiratory phasicity and response to augmentation. Saphenofemoral Junction: No evidence of thrombus. Normal compressibility and flow on color Doppler imaging. Profunda Femoral Vein: No evidence of thrombus.  Normal compressibility and flow on color Doppler imaging. Femoral Vein: No evidence of thrombus. Normal compressibility, respiratory phasicity and response to augmentation. Popliteal Vein: No evidence of thrombus. Normal compressibility, respiratory phasicity and response to augmentation. Calf Veins: No evidence of thrombus. Normal compressibility and flow on color Doppler imaging. Superficial Great Saphenous Vein: No evidence of thrombus. Normal compressibility and flow on color Doppler imaging. Venous Reflux:  None. Other Findings: There are 2 lymph nodes noted in the left inguinal region measuring 3.7 x 1.6 x 1.3 cm and 3.2 x 1.6 x 2 cm. The latter is slightly more lateral in position and there is only a scant amount of hilar fat suggested. IMPRESSION: No evidence of deep venous thrombosis. Left inguinal lymphadenopathy the more lateral of which measures 3.2 x 1.6 x 2 cm and there is a suggestion of hilar fatty replacement. Percutaneous sampling may prove useful for further evaluation. Electronically Signed   By: Tollie Eth M.D.   On: 05/22/2016 20:34   Dg Foot Complete Left  Addendum Date: 05/24/2016   ADDENDUM REPORT: 05/24/2016 00:15 ADDENDUM: Clinical findings not concerning for infection and more consistent with Charcot joint. Bony destruction with adjacent osseous debris can be seen in the setting of Charcot arthropathy. Electronically Signed   By: Rubye Oaks M.D.   On: 05/24/2016 00:15   Result Date: 05/24/2016 CLINICAL DATA:  Left lower extremity redness and swelling. Plantar foot ulcer. Diabetic foot. EXAM: LEFT FOOT - COMPLETE 3+ VIEW COMPARISON:  None. FINDINGS: Bony destructive change involves the distal first metatarsal extending to the metatarsal phalangeal joint. Prominent transverse lucency with lucencies extending to the articular surface  seen and periarticular osseous fragments. Decreased density of the proximal first phalanx with bony destructive change and surrounding osseous  fragments. There is associated skin and soft tissue thickening. Curvilinear densities in the soft tissues in the plantar foot may be calcifications or foreign bodies. No additional sites concerning for osteomyelitis. Diffuse soft tissue edema of the foot. Vascular calcifications are seen. IMPRESSION: Findings consistent with osteomyelitis and septic arthropathy of the first metatarsal phalangeal joint with bony destruction of the distal first metatarsal and proximal phalanx. Curvilinear densities in the subjacent plantar soft tissues may be calcifications or soft tissue foreign bodies. Electronically Signed: By: Rubye Oaks M.D. On: 05/22/2016 19:43     CBC  Recent Labs Lab 05/22/16 2005 05/23/16 0358 05/24/16 0403  WBC 8.8 8.1 6.6  HGB 10.9* 9.9* 9.8*  HCT 32.0* 29.1* 28.8*  PLT 286 285 318  MCV 90.3 90.7 90.0  MCH 30.7 30.8 30.7  MCHC 34.0 34.0 34.1  RDW 14.7* 14.7* 14.8*  LYMPHSABS 2.2  --   --   MONOABS 0.8  --   --   EOSABS 0.0  --   --   BASOSABS 0.1  --   --     Chemistries   Recent Labs Lab 05/22/16 1949 05/23/16 0358 05/24/16 0403  NA 135 136 138  K 4.1 3.7 3.6  CL 101 106 107  CO2 25 24 24   GLUCOSE 225* 236* 151*  BUN 14 16 14   CREATININE 0.79 0.84 0.82  CALCIUM 9.0 8.5* 8.5*  AST 16  --   --   ALT 11*  --   --   ALKPHOS 74  --   --   BILITOT 0.7  --   --    ------------------------------------------------------------------------------------------------------------------ estimated creatinine clearance is 126.2 mL/min (by C-G formula based on SCr of 0.82 mg/dL). ------------------------------------------------------------------------------------------------------------------  Recent Labs  05/23/16 0358  HGBA1C 8.4*   ------------------------------------------------------------------------------------------------------------------ No results for input(s): CHOL, HDL, LDLCALC, TRIG, CHOLHDL, LDLDIRECT in the last 72  hours. ------------------------------------------------------------------------------------------------------------------ No results for input(s): TSH, T4TOTAL, T3FREE, THYROIDAB in the last 72 hours.  Invalid input(s): FREET3 ------------------------------------------------------------------------------------------------------------------ No results for input(s): VITAMINB12, FOLATE, FERRITIN, TIBC, IRON, RETICCTPCT in the last 72 hours.  Coagulation profile  Recent Labs Lab 05/22/16 1904  INR 1.33    No results for input(s): DDIMER in the last 72 hours.  Cardiac Enzymes No results for input(s): CKMB, TROPONINI, MYOGLOBIN in the last 168 hours.  Invalid input(s): CK ------------------------------------------------------------------------------------------------------------------ Invalid input(s): POCBNP    Assessment & Plan  Patient is a 56 year old African-American male with diabetes presenting with foot swelling 1. Suspected Osteomyelitis (HCC) - Continue antibiotics for now , patient's symptoms could be due to due to  Charcot arthropathy, appreciated podiatry input , they will reevaluate him today and give their further opinion  2.  HTN (hypertension) - continue Cardizem blood pressure stable  3.   Diabetes (HCC) - on sliding scale I will restart his Lantus   4.   CAD (coronary artery disease) - continue home meds   5.Paroxysmal atrial fibrillation (HCC) - patient's heart rate was in the 140s symptomatic I have discontinued his long-acting Cardizem I'll place him on Cardizem short acting give him a dose of dig and metoprolol.     Code Status Orders        Start     Ordered   05/22/16 2324  Full code  Continuous     05/22/16 2323    Code Status History    Date Active Date  Inactive Code Status Order ID Comments User Context   This patient has a current code status but no historical code status.           Consults  podiatry  DVT Prophylaxis  SCDs      Lab Results  Component Value Date   PLT 318 05/24/2016     Time Spent in minutes   Greater than 50% of time spent in care coordination and counseling patient regarding the condition and plan of care.   Auburn Bilberry M.D on 05/24/2016 at 12:17 PM  Between 7am to 6pm - Pager - [phone]  After 6pm go to [email or web address] - password [email or web address] [facility]  [facility]   Office  [phone]

## 2016-05-24 NOTE — Care Management Note (Signed)
Case Management Note  Patient Details  Name: [name] MRN: [id number] Date of Birth: [birth year]  Subjective/Objective:                  RNCM met with patient to discuss [facility] affiliation and discharge planning needs. His PCP is with [facility] and in order to get DME and home health services he will have to go through his [place] PCP. He wishes to remain at [facility] for treatment under [employer]. He states he lives with his girlfriend and "has everything he needs at home". He works at [employer] and asked RNCM to have MD complete FMLA paper work.   Action/Plan: RNCM has faxed [facility] forms to [facility] with patient request to stay at [facility]. RNCM to fax discharge summary to [place] when he discharges. RNCM to continue to follow. Per MD patient will need to be out of work at minimum 4 weeks- FMLA forms completed and faxed to [place].   Expected Discharge Date:  05/24/16               Expected Discharge Plan:     In-House Referral:     Discharge planning Services  CM Consult  Post Acute Care Choice:    Choice offered to:  Patient (transfer to [facility] options provided)  DME Arranged:    DME Agency:     HH Arranged:    [place] Agency:     Status of Service:  In process, will continue to follow  If discussed at Long Length of Stay Meetings, dates discussed:    Additional Comments:  Marshell Garfinkel, RN 05/24/2016, 12:26 PM

## 2016-05-24 NOTE — Progress Notes (Signed)
Patient Demographics  Ralph SoWilliam Spear, is a 56 y.o. male   MRN: [id number]   DOB - 13-Mar-1961  Admit Date - 05/22/2016    Outpatient Primary MD for the patient is No PCP Per Patient  Consult requested in the Hospital by Auburn BilberryShreyang Patel, MD, On 05/24/2016  With History of -  Past Medical History:  Diagnosis Date  . Atrial fibrillation (HCC)   . CHF (congestive heart failure) (HCC)   . Coronary artery disease   . Diabetes mellitus without complication (HCC)   . DVT (deep venous thrombosis) (HCC)   . Hypertension       Past Surgical History:  Procedure Laterality Date  . NO PAST SURGERIES      in for   Chief Complaint  Patient presents with  . Leg Pain     HPI  Ralph Meadows  is a 56 y.o. male, Admitted 2 days ago with increased swelling x-ray showed bone degenerative changes to the left great toe joint. The red increase in his white count or significant erythema to the foot based on x-rays and clinical findings a likely has a probable Charcot arthropathy and fractures to the first metatarsal base of proximal phalanx.    Review of Systems    In addition to the HPI above,  No Fever-chills, No Headache, No changes with Vision or hearing, No problems swallowing food or Liquids, No Chest pain, Cough or Shortness of Breath, No Abdominal pain, No Nausea or Vommitting, Bowel movements are regular, No Blood in stool or Urine, No dysuria, No new skin rashes or bruises, No new joints pains-aches,  No new weakness, tingling, numbness in any extremity, No recent weight gain or loss, No polyuria, polydypsia or polyphagia, No significant Mental Stressors.  A full 10 point Review of Systems was done, except as stated above, all other Review of Systems were negative.   Social History Social History   Substance Use Topics  . Smoking status: Former Games developermoker  . Smokeless tobacco: Never Used  . Alcohol use Yes     Comment: occasionally  Family History Family History  Problem Relation Age of Onset  . Hypertension Father   . Diabetes Maternal Grandfather   . Stroke Paternal Grandfather    Prior to Admission medications   Medication Sig Start Date End Date Taking? Authorizing Provider  apixaban (ELIQUIS) 5 MG TABS tablet Take 5 mg by mouth 2 (two) times daily.   Yes Historical Provider, MD  atorvastatin (LIPITOR) 80 MG tablet Take 80 mg by mouth every evening.   Yes Historical Provider, MD  diltiazem (DILACOR XR) 240 MG 24 hr capsule Take 240 mg by mouth daily.   Yes Historical Provider, MD  insulin aspart (NOVOLOG) 100 UNIT/ML injection Inject 12 Units into the skin 3 (three) times daily before meals.   Yes Historical Provider, MD  insulin glargine (LANTUS) 100 UNIT/ML injection Inject 36 Units into the skin at bedtime.   Yes Historical Provider, MD  losartan (COZAAR) 100 MG tablet Take 100 mg by mouth daily.   Yes Historical Provider, MD  metFORMIN (GLUCOPHAGE-XR) 500 MG 24 hr tablet Take 1,000 mg by mouth 2 (two) times daily.   Yes Historical  Provider, MD  potassium chloride SA (K-DUR,KLOR-CON) 20 MEQ tablet Take 20 mEq by mouth daily.   Yes Historical Provider, MD  cephALEXin (KEFLEX) 500 MG capsule Take 1 capsule (500 mg total) by mouth 3 (three) times daily. Patient not taking: Reported on 05/22/2016 08/14/15   Ignacia Bayley, PA-C  oxyCODONE-acetaminophen (ROXICET) 5-325 MG tablet Take 1 tablet by mouth every 6 (six) hours as needed. Patient not taking: Reported on 05/22/2016 08/14/15   Ignacia Bayley, PA-C  sulfamethoxazole-trimethoprim (BACTRIM DS,SEPTRA DS) 800-160 MG tablet Take 1 tablet by mouth 2 (two) times daily. Patient not taking: Reported on 05/22/2016 06/01/15   Joni Reining, PA-C    Anti-infectives    Start     Dose/Rate Route Frequency Ordered Stop   05/23/16 2000  ceFEPIme  (MAXIPIME) 2 g in dextrose 5 % 50 mL IVPB     2 g 100 mL/hr over 30 Minutes Intravenous Every 8 hours 05/23/16 1320     05/23/16 0500  vancomycin (VANCOCIN) 1,250 mg in sodium chloride 0.9 % 250 mL IVPB     1,250 mg 166.7 mL/hr over 90 Minutes Intravenous Every 8 hours 05/22/16 2029     05/23/16 0200  piperacillin-tazobactam (ZOSYN) IVPB 4.5 g  Status:  Discontinued     4.5 g 200 mL/hr over 30 Minutes Intravenous Every 8 hours 05/22/16 2328 05/23/16 1320   05/22/16 2000  vancomycin (VANCOCIN) 2,000 mg in sodium chloride 0.9 % 500 mL IVPB     2,000 mg 250 mL/hr over 120 Minutes Intravenous  Once 05/22/16 1952 05/22/16 2242   05/22/16 1915  piperacillin-tazobactam (ZOSYN) IVPB 3.375 g     3.375 g 100 mL/hr over 30 Minutes Intravenous  Once 05/22/16 1914 05/22/16 1954      Scheduled Meds: . atorvastatin  80 mg Oral QPM  . ceFEPime (MAXIPIME) IV  2 g Intravenous Q8H  . diltiazem  90 mg Oral Q6H  . enoxaparin (LOVENOX) injection  40 mg Subcutaneous BID  . insulin aspart  0-9 Units Subcutaneous Q6H  . insulin glargine  36 Units Subcutaneous QHS  . metoprolol tartrate  50 mg Oral BID  . vancomycin  1,250 mg Intravenous Q8H   Continuous Infusions: PRN Meds:.acetaminophen **OR** acetaminophen, ondansetron **OR** ondansetron (ZOFRAN) IV, oxyCODONE-acetaminophen  Allergies  Allergen Reactions  . Lisinopril Cough  . Latex Rash    Physical Exam  Vitals  Blood pressure 123/75, pulse 100, temperature 99.2 F (37.3 C), temperature source Oral, resp. rate 18, height 5\' 7"  (1.702 m), weight 122.5 kg (270 lb), SpO2 100 %.  Lower Extremity exam:Left first metatarsophalangeal joint still notably swollen with a lot of fluctuance submetatarsal 1. I did aspirate this area again following a sterile prep. I removed 5 cc of bloody fluid from area today. That makes total of about 15-20 cc combined with yesterday and today.  Data Review  CBC  Recent Labs Lab 05/22/16 2005 05/23/16 0358  05/24/16 0403  WBC 8.8 8.1 6.6  HGB 10.9* 9.9* 9.8*  HCT 32.0* 29.1* 28.8*  PLT 286 285 318  MCV 90.3 90.7 90.0  MCH 30.7 30.8 30.7  MCHC 34.0 34.0 34.1  RDW 14.7* 14.7* 14.8*  LYMPHSABS 2.2  --   --   MONOABS 0.8  --   --   EOSABS 0.0  --   --   BASOSABS 0.1  --   --    ------------------------------------------------------------------------------------------------------------------  Chemistries   Recent Labs Lab 05/22/16 1949 05/23/16 0358 05/24/16 0403  NA 135 136 138  K 4.1 3.7 3.6  CL 101 106 107  CO2 25 24 24   GLUCOSE 225* 236* 151*  BUN 14 16 14   CREATININE 0.79 0.84 0.82  CALCIUM 9.0 8.5* 8.5*  AST 16  --   --   ALT 11*  --   --   ALKPHOS 74  --   --   BILITOT 0.7  --   --    ------------------------------------------------------------------------------------------------------------------ estimated creatinine clearance is 126.2 mL/min (by C-G formula based on SCr of 0.82 mg/dL). ------------------------------------------------------------------------------------------------------------------ No results for input(s): TSH, T4TOTAL, T3FREE, THYROIDAB in the last 72 hours.  Invalid input(s): FREET3   Coagulation profile  Recent Labs Lab 05/22/16 1904  INR 1.33  ---------------------------------------------------------------------------------------------------------------  Urinalysis    Component Value Date/Time   COLORURINE YELLOW (A) 05/23/2016 0830   APPEARANCEUR CLEAR (A) 05/23/2016 0830   APPEARANCEUR Clear 10/14/2013 1715   LABSPEC 1.027 05/23/2016 0830   LABSPEC 1.010 10/14/2013 1715   PHURINE 5.0 05/23/2016 0830   GLUCOSEU NEGATIVE 05/23/2016 0830   GLUCOSEU Negative 10/14/2013 1715   HGBUR NEGATIVE 05/23/2016 0830   BILIRUBINUR NEGATIVE 05/23/2016 0830   BILIRUBINUR Negative 10/14/2013 1715   KETONESUR NEGATIVE 05/23/2016 0830   PROTEINUR NEGATIVE 05/23/2016 0830   NITRITE NEGATIVE 05/23/2016 0830   LEUKOCYTESUR NEGATIVE 05/23/2016  0830   LEUKOCYTESUR Negative 10/14/2013 1715     Imaging results:   Dg Ankle Complete Left  Result Date: 05/22/2016 CLINICAL DATA:  Left lower extremity redness and swelling. Plantar foot ulcer. Diabetic foot. EXAM: LEFT ANKLE COMPLETE - 3+ VIEW COMPARISON:  None. FINDINGS: Diffuse subcutaneous edema and skin thickening. Mild osteoarthritis of the ankle. Destructive changes of the first metatarsal are better assessed on concurrent foot radiograph. No bony destructive change or findings of osteomyelitis of the ankle. Multiple soft tissue phleboliths and vascular calcifications. No evidence of tibial talar joint effusion. IMPRESSION: Soft tissue edema. No evidence of osteomyelitis of the ankle. First metatarsal changes described on dedicated foot radiograph performed concurrently. Electronically Signed   By: Rubye Oaks M.D.   On: 05/22/2016 19:40   US Venous Img Lower Unilateral Left  Result Date: 05/22/2016 CLINICAL DATA:  Left leg erythema and swelling EXAM: LEFT LOWER EXTREMITY VENOUS DOPPLER ULTRASOUND TECHNIQUE: Gray-scale sonography with graded compression, as well as color Doppler and duplex ultrasound were performed to evaluate the lower extremity deep venous systems from the level of the common femoral vein and including the common femoral, femoral, profunda femoral, popliteal and calf veins including the posterior tibial, peroneal and gastrocnemius veins when visible. The superficial great saphenous vein was also interrogated. Spectral Doppler was utilized to evaluate flow at rest and with distal augmentation maneuvers in the common femoral, femoral and popliteal veins. COMPARISON:  None. FINDINGS: Contralateral Common Femoral Vein: Respiratory phasicity is normal and symmetric with the symptomatic side. No evidence of thrombus. Normal compressibility. Common Femoral Vein: No evidence of thrombus. Normal compressibility, respiratory phasicity and response to augmentation. Saphenofemoral  Junction: No evidence of thrombus. Normal compressibility and flow on color Doppler imaging. Profunda Femoral Vein: No evidence of thrombus. Normal compressibility and flow on color Doppler imaging. Femoral Vein: No evidence of thrombus. Normal compressibility, respiratory phasicity and response to augmentation. Popliteal Vein: No evidence of thrombus. Normal compressibility, respiratory phasicity and response to augmentation. Calf Veins: No evidence of thrombus. Normal compressibility and flow on color Doppler imaging. Superficial Great Saphenous Vein: No evidence of thrombus. Normal compressibility and flow on color Doppler imaging. Venous Reflux:  None. Other Findings: There are 2  lymph nodes noted in the left inguinal region measuring 3.7 x 1.6 x 1.3 cm and 3.2 x 1.6 x 2 cm. The latter is slightly more lateral in position and there is only a scant amount of hilar fat suggested. IMPRESSION: No evidence of deep venous thrombosis. Left inguinal lymphadenopathy the more lateral of which measures 3.2 x 1.6 x 2 cm and there is a suggestion of hilar fatty replacement. Percutaneous sampling may prove useful for further evaluation. Electronically Signed   By: Tollie Eth M.D.   On: 05/22/2016 20:34   Dg Foot Complete Left  Addendum Date: 05/24/2016   ADDENDUM REPORT: 05/24/2016 00:15 ADDENDUM: Clinical findings not concerning for infection and more consistent with Charcot joint. Bony destruction with adjacent osseous debris can be seen in the setting of Charcot arthropathy. Electronically Signed   By: Rubye Oaks M.D.   On: 05/24/2016 00:15   Result Date: 05/24/2016 CLINICAL DATA:  Left lower extremity redness and swelling. Plantar foot ulcer. Diabetic foot. EXAM: LEFT FOOT - COMPLETE 3+ VIEW COMPARISON:  None. FINDINGS: Bony destructive change involves the distal first metatarsal extending to the metatarsal phalangeal joint. Prominent transverse lucency with lucencies extending to the articular surface seen  and periarticular osseous fragments. Decreased density of the proximal first phalanx with bony destructive change and surrounding osseous fragments. There is associated skin and soft tissue thickening. Curvilinear densities in the soft tissues in the plantar foot may be calcifications or foreign bodies. No additional sites concerning for osteomyelitis. Diffuse soft tissue edema of the foot. Vascular calcifications are seen. IMPRESSION: Findings consistent with osteomyelitis and septic arthropathy of the first metatarsal phalangeal joint with bony destruction of the distal first metatarsal and proximal phalanx. Curvilinear densities in the subjacent plantar soft tissues may be calcifications or soft tissue foreign bodies. Electronically Signed: By: Rubye Oaks M.D. On: 05/22/2016 19:43     Assessment & Plan: Aspiration of the area today to remove further hemorrhagic drainage from the area. No evidence of purulence. Still no significant redness cellulitis to the foot. Recommend continue stay completely nonweightbearing I will fix a splint for tomorrow so he can start to move around more and start considering discharge form. I explained to him this will take a long time to heal and stabilize that he may ultimately need further surgery for reconstruction on the area at some point.  Principal Problem:   Osteomyelitis (HCC) Active Problems:   HTN (hypertension)   Diabetes (HCC)   CAD (coronary artery disease)   Paroxysmal atrial fibrillation [facility])     Family Communication: Plan discussed with patient    Epimenio Sarin M.D on 05/24/2016 at 1:25 PM  Thank you for the consult, we will follow the patient with you in the Hospital.

## 2016-05-24 NOTE — Progress Notes (Signed)
Inpatient Diabetes Program Recommendations  AACE/ADA: New Consensus Statement on Inpatient Glycemic Control (2015)  Target Ranges:  Prepandial:   less than 140 mg/dL      Peak postprandial:   less than 180 mg/dL (1-2 hours)      Critically ill patients:  140 - 180 mg/dL  Results for Ralph Meadows, [name] (MRN [id number]) as of 05/24/2016 10:35  Ref. Range 05/23/2016 05:10 05/23/2016 12:04 05/23/2016 17:08 05/23/2016 23:14 05/24/2016 04:48  Glucose-Capillary Latest Ref Range: 65 - 99 mg/dL 578236 (H) 469182 (H) 629228 (H) 234 (H) 142 (H)    Review of Glycemic Control  Diabetes history: DM2 Outpatient Diabetes medications: Lantus 36 units QHS, Novolog 12 units TID with meals, Metformin 1000 mg BID Current orders for Inpatient glycemic control: Lantus 36 units QHS, Novolog 0-9 units Q6H  Inpatient Diabetes Program Recommendations: Correction (SSI): If patient is eating well, please consider changing frequency of CBGs and Novolog correction to ACHS.  Thanks, Orlando PennerMarie Darshay Deupree, RN, MSN, CDE Diabetes Coordinator Inpatient Diabetes Program [phone] (Team Pager from 8am to 5pm)

## 2016-05-24 NOTE — Progress Notes (Signed)
Pharmacy Antibiotic Note  Ralph Meadows is a 56 y.o. male admitted on 05/22/2016 with cellulitis/suspected OM.  Pharmacy has been consulted for vancomycin dosing.  Plan: 2/28 2032 vancomycin trough 21 mcg/mL. Recalculated Ke 0.084 hr-1. Reduce dose to vancomycin 1.5 gm IV Q12H, predicted trough 15 mcg/mL. Pharmacy will continue to follow and adjust as needed to maintain trough 15 to 20 mcg/mL.   Height: 5\' 7"  (170.2 cm) Weight: 270 lb (122.5 kg) IBW/kg (Calculated) : 66.1  Temp (24hrs), Avg:98.8 F (37.1 C), Min:98.2 F (36.8 C), Max:99.3 F (37.4 C)   Recent Labs Lab 05/22/16 1923 05/22/16 1949 05/22/16 2005 05/23/16 0358 05/23/16 2016 05/24/16 0403 05/24/16 2032  WBC  --   --  8.8 8.1  --  6.6  --   CREATININE  --  0.79  --  0.84  --  0.82  --   LATICACIDVEN 1.8  --   --   --   --   --   --   VANCOTROUGH  --   --   --   --  18  --  21*    Estimated Creatinine Clearance: 126.2 mL/min (by C-G formula based on SCr of 0.82 mg/dL).    Allergies  Allergen Reactions  . Lisinopril Cough  . Latex Rash    Antimicrobials this admission: 2/26 zosyn >>    Dose adjustments this admission:   Microbiology results: 2/26 BCx: sent  Thank you for allowing pharmacy to be a part of this patient's care.  Onesty Clair A. Dahlia Bailiffookson, PharmD, BCPS Clinical Pharmacist 05/24/2016

## 2016-05-25 LAB — GLUCOSE, CAPILLARY
GLUCOSE-CAPILLARY: 158 mg/dL — AB (ref 65–99)
GLUCOSE-CAPILLARY: 160 mg/dL — AB (ref 65–99)
GLUCOSE-CAPILLARY: 199 mg/dL — AB (ref 65–99)
Glucose-Capillary: 200 mg/dL — ABNORMAL HIGH (ref 65–99)
Glucose-Capillary: 214 mg/dL — ABNORMAL HIGH (ref 65–99)

## 2016-05-25 LAB — BASIC METABOLIC PANEL
ANION GAP: 8 (ref 5–15)
BUN: 15 mg/dL (ref 6–20)
CO2: 23 mmol/L (ref 22–32)
CREATININE: 0.87 mg/dL (ref 0.61–1.24)
Calcium: 8.5 mg/dL — ABNORMAL LOW (ref 8.9–10.3)
Chloride: 107 mmol/L (ref 101–111)
Glucose, Bld: 186 mg/dL — ABNORMAL HIGH (ref 65–99)
Potassium: 3.6 mmol/L (ref 3.5–5.1)
SODIUM: 138 mmol/L (ref 135–145)

## 2016-05-25 MED ORDER — CEPHALEXIN 500 MG PO CAPS
500.0000 mg | ORAL_CAPSULE | Freq: Four times a day (QID) | ORAL | Status: DC
  Administered 2016-05-25 – 2016-05-26 (×3): 500 mg via ORAL
  Filled 2016-05-25 (×3): qty 1

## 2016-05-25 NOTE — Care Management (Signed)
Discharge delayed due to podiatry concern-OR today. Home health needed per Dr. Orland Jarredroxler. I [facility]. His PCP is Dr. Evangeline GulaGenevieve Embree [facility]([employer] [place] ext. [phone]; [phone]; [phone]).  Message left for Marylene Landngela to call this RNCM. Fax [phone].

## 2016-05-25 NOTE — Progress Notes (Signed)
Patient Demographics  Ralph Meadows, is a 56 y.o. male   MRN: [id number]   DOB - [birth year]  Admit Date - 05/22/2016    Outpatient Primary MD for the patient is No PCP Per Patient  Consult requested in the Hospital by Auburn Bilberry, MD, On 05/25/2016   With History of -  Past Medical History:  Diagnosis Date  . Atrial fibrillation (HCC)   . CHF (congestive heart failure) (HCC)   . Coronary artery disease   . Diabetes mellitus without complication (HCC)   . DVT (deep venous thrombosis) (HCC)   . Hypertension       Past Surgical History:  Procedure Laterality Date  . NO PAST SURGERIES      in for   Chief Complaint  Patient presents with  . Leg Pain     HPI  Ulas Zuercher  is a 56 y.o. male, And hospitalized on Sunday night with extreme swelling to the left great toe joint. He is diabetic had a history of an ulceration but no open wound at this timeframe. X-ray findings and clinical findings were consistent with Charcot arthropathy. I did drain on 2 separate occasions blood from the plantar first metatarsal head.    Review of Systems    In addition to the HPI above,  No Fever-chills, No Headache, No changes with Vision or hearing, No problems swallowing food or Liquids, No Chest pain, Cough or Shortness of Breath, No Abdominal pain, No Nausea or Vommitting, Bowel movements are regular, No Blood in stool or Urine, No dysuria, No new skin rashes or bruises, No new joints pains-aches,  No new weakness, tingling, numbness in any extremity, No recent weight gain or loss, No polyuria, polydypsia or polyphagia, No significant Mental Stressors.  A full 10 point Review of Systems was done, except as stated above, all other Review of Systems were negative.   Social History Social History   Substance Use Topics  . Smoking status: Former Games developer  . Smokeless tobacco: Never Used  . Alcohol use Yes     Comment: occasionally  Family History Family History  Problem Relation Age of Onset  . Hypertension Father   . Diabetes Maternal Grandfather   . Stroke Paternal Grandfather     Prior to Admission medications   Medication Sig Start Date End Date Taking? Authorizing Provider  apixaban (ELIQUIS) 5 MG TABS tablet Take 5 mg by mouth 2 (two) times daily.   Yes Historical Provider, MD  atorvastatin (LIPITOR) 80 MG tablet Take 80 mg by mouth every evening.   Yes Historical Provider, MD  diltiazem (DILACOR XR) 240 MG 24 hr capsule Take 240 mg by mouth daily.   Yes Historical Provider, MD  insulin aspart (NOVOLOG) 100 UNIT/ML injection Inject 12 Units into the skin 3 (three) times daily before meals.   Yes Historical Provider, MD  insulin glargine (LANTUS) 100 UNIT/ML injection Inject 36 Units into the skin at bedtime.   Yes Historical Provider, MD  losartan (COZAAR) 100 MG tablet Take 100 mg by mouth daily.   Yes Historical Provider, MD  metFORMIN (GLUCOPHAGE-XR) 500 MG 24 hr tablet Take 1,000 mg by mouth 2 (two) times daily.  Yes Historical Provider, MD  potassium chloride SA (K-DUR,KLOR-CON) 20 MEQ tablet Take 20 mEq by mouth daily.   Yes Historical Provider, MD  cephALEXin (KEFLEX) 500 MG capsule Take 1 capsule (500 mg total) by mouth 3 (three) times daily. Patient not taking: Reported on 05/22/2016 08/14/15   Ignacia Bayleyobert Tumey, PA-C  oxyCODONE-acetaminophen (ROXICET) 5-325 MG tablet Take 1 tablet by mouth every 6 (six) hours as needed. Patient not taking: Reported on 05/22/2016 08/14/15   Ignacia Bayleyobert Tumey, PA-C  sulfamethoxazole-trimethoprim (BACTRIM DS,SEPTRA DS) 800-160 MG tablet Take 1 tablet by mouth 2 (two) times daily. Patient not taking: Reported on 05/22/2016 06/01/15   Joni Reiningonald K Smith, PA-C    Anti-infectives    Start     Dose/Rate Route Frequency Ordered Stop   05/24/16 2200   vancomycin (VANCOCIN) 1,500 mg in sodium chloride 0.9 % 500 mL IVPB     1,500 mg 250 mL/hr over 120 Minutes Intravenous Every 12 hours 05/24/16 2112     05/23/16 2000  ceFEPIme (MAXIPIME) 2 g in dextrose 5 % 50 mL IVPB     2 g 100 mL/hr over 30 Minutes Intravenous Every 8 hours 05/23/16 1320     05/23/16 0500  vancomycin (VANCOCIN) 1,250 mg in sodium chloride 0.9 % 250 mL IVPB  Status:  Discontinued     1,250 mg 166.7 mL/hr over 90 Minutes Intravenous Every 8 hours 05/22/16 2029 05/24/16 2112   05/23/16 0200  piperacillin-tazobactam (ZOSYN) IVPB 4.5 g  Status:  Discontinued     4.5 g 200 mL/hr over 30 Minutes Intravenous Every 8 hours 05/22/16 2328 05/23/16 1320   05/22/16 2000  vancomycin (VANCOCIN) 2,000 mg in sodium chloride 0.9 % 500 mL IVPB     2,000 mg 250 mL/hr over 120 Minutes Intravenous  Once 05/22/16 1952 05/22/16 2242   05/22/16 1915  piperacillin-tazobactam (ZOSYN) IVPB 3.375 g     3.375 g 100 mL/hr over 30 Minutes Intravenous  Once 05/22/16 1914 05/22/16 1954      Scheduled Meds: . atorvastatin  80 mg Oral QPM  . ceFEPime (MAXIPIME) IV  2 g Intravenous Q8H  . diltiazem  90 mg Oral Q6H  . enoxaparin (LOVENOX) injection  40 mg Subcutaneous BID  . insulin aspart  0-9 Units Subcutaneous TID WC  . insulin glargine  36 Units Subcutaneous QHS  . metoprolol tartrate  50 mg Oral BID  . vancomycin  1,500 mg Intravenous Q12H   Continuous Infusions: PRN Meds:.acetaminophen **OR** acetaminophen, ondansetron **OR** ondansetron (ZOFRAN) IV, oxyCODONE-acetaminophen  Allergies  Allergen Reactions  . Lisinopril Cough  . Latex Rash    Physical Exam  Vitals  Blood pressure 111/75, pulse 87, temperature 98.9 F (37.2 C), resp. rate 18, height 5\' 7"  (1.702 m), weight 122.5 kg (270 lb), SpO2 98 %.  Lower Extremity exam:Exam today shows some improvement in the swelling with not as much fluctuance underneath the first metatarsal head.Marland Kitchen. His x-ray showed severe comminuted fracture  to the first metatarsal head. Was considered a possibility of osteomyelitis but symptoms more consistent with Charcot arthropathy since his white count never elevated and the also had no real purulence with the fluid removed from the fluctuance underneath first metatarsal head. It was mostly bloody. Culture did come back with a scant growth of staph aureus however and this will need to be treated. White count was originally 8.8 and came down to 6.6 after 2 days of IV antibiotics. Hemoglobin and hematocrit also dropped.   Data Review  CBC  Recent Labs  Lab 05/22/16 2005 05/23/16 0358 05/24/16 0403  WBC 8.8 8.1 6.6  HGB 10.9* 9.9* 9.8*  HCT 32.0* 29.1* 28.8*  PLT 286 285 318  MCV 90.3 90.7 90.0  MCH 30.7 30.8 30.7  MCHC 34.0 34.0 34.1  RDW 14.7* 14.7* 14.8*  LYMPHSABS 2.2  --   --   MONOABS 0.8  --   --   EOSABS 0.0  --   --   BASOSABS 0.1  --   --    ------------------------------------------------------------------------------------------------------------------  Chemistries   Recent Labs Lab 05/22/16 1949 05/23/16 0358 05/24/16 0403 05/25/16 0350  NA 135 136 138 138  K 4.1 3.7 3.6 3.6  CL 101 106 107 107  CO2 25 24 24 23   GLUCOSE 225* 236* 151* 186*  BUN 14 16 14 15   CREATININE 0.79 0.84 0.82 0.87  CALCIUM 9.0 8.5* 8.5* 8.5*  AST 16  --   --   --   ALT 11*  --   --   --   ALKPHOS 74  --   --   --   BILITOT 0.7  --   --   --    ---------------------------------------------------------------------------------------------------------------------------------------------------------------------------------------------------------- ---------------------------------------------------------------------------------------------------------------  Urinalysis    Component Value Date/Time   COLORURINE YELLOW (A) 05/23/2016 0830   APPEARANCEUR CLEAR (A) 05/23/2016 0830   APPEARANCEUR Clear 10/14/2013 1715   LABSPEC 1.027 05/23/2016 0830   LABSPEC 1.010 10/14/2013 1715    PHURINE 5.0 05/23/2016 0830   GLUCOSEU NEGATIVE 05/23/2016 0830   GLUCOSEU Negative 10/14/2013 1715   HGBUR NEGATIVE 05/23/2016 0830   BILIRUBINUR NEGATIVE 05/23/2016 0830   BILIRUBINUR Negative 10/14/2013 1715   KETONESUR NEGATIVE 05/23/2016 0830   PROTEINUR NEGATIVE 05/23/2016 0830   NITRITE NEGATIVE 05/23/2016 0830   LEUKOCYTESUR NEGATIVE 05/23/2016 0830   LEUKOCYTESUR Negative 10/14/2013 1715     Assessment & Plan: Overall the foot looks better at this point is not as much fluctuance and swelling has reduced some. Still no real erythema to the region. No evidence of infection hourly. Culture showed light growth of staph and this will need to be treated but I don't think it is the causative process of the first metatarsal head breakdown. Putting this is likely Charcot arthropathy. I do think we need to keep him on likely some clindamycin since it is sensitive to that. Plan: Apply dressing and the splint for stabilization to the left foot today. Ordered physical therapy to start getting about movement around some with bathroom privileges and the crutch use at this point he has a knee risk to her home that he can utilize to stay nonweightbearing when he gets there. Recommend likely clindamycin for antibiotic treatment since he did have a light growth of staph sensitive to that. We will follow him next week in the office.  Principal Problem:   Osteomyelitis (HCC) Active Problems:   HTN (hypertension)   Diabetes (HCC)   CAD (coronary artery disease)   Paroxysmal atrial fibrillation [facility])     Family Communication: Plan discussed with patient and **   Thank you for the consult, we will follow the patient with you in the Hospital.   Epimenio Sarin M.D on 05/25/2016 at 1:26 PM  Thank you for the consult, we will follow the patient with you in the Hospital.

## 2016-05-25 NOTE — Progress Notes (Signed)
Patient is A&O x4. Blood sugars 199 and 200, received SS insulin coverage. Good appetite. Using urinal at bedside. Tele in place, running A.fib 60-70's. Tolerated IV ABX. Splint placed to LLE, per MD this shift. No c/o pain. Bed alarm on for safety.

## 2016-05-25 NOTE — Progress Notes (Signed)
[employer] [facility] [place] [facility]                                                                                                                                                                                  Patient Demographics   Ralph SoWilliam Betancur, is a 56 y.o. male, DOB - Feb 13, 1961, EAV:[id number]  Admit date - 05/22/2016   Admitting Physician Oralia Manisavid Willis, MD  Outpatient Primary MD for the patient is No PCP Per Patient   LOS - 3  Subjective: Patient doing better heart rate stable. He had drainage of the fluid yesterday by podiatry  Review of Systems:   CONSTITUTIONAL: No documented fever. No fatigue, weakness. No weight gain, no weight loss.  EYES: No blurry or double vision.  ENT: No tinnitus. No postnasal drip. No redness of the oropharynx.  RESPIRATORY: No cough, no wheeze, no hemoptysis. No dyspnea.  CARDIOVASCULAR: No chest pain. No orthopnea. No palpitations. No syncope.  GASTROINTESTINAL: No nausea, no vomiting or diarrhea. No abdominal pain. No melena or hematochezia.  GENITOURINARY: No dysuria or hematuria.  ENDOCRINE: No polyuria or nocturia. No heat or cold intolerance.  HEMATOLOGY: No anemia. No bruising. No bleeding.  INTEGUMENTARY: No rashes. No lesions. Left foot swelling MUSCULOSKELETAL: No arthritis. No swelling. No gout.  NEUROLOGIC: No numbness, tingling, or ataxia. No seizure-type activity.  PSYCHIATRIC: No anxiety. No insomnia. No ADD.    Vitals:   Vitals:   05/24/16 1949 05/25/16 0013 05/25/16 0331 05/25/16 0755  BP: (!) 98/55 104/79 110/74 111/75  Pulse: 71 72 75 87  Resp: 19 18 18    Temp: 98.4 F (36.9 C)  99.2 F (37.3 C) 98.9 F (37.2 C)  TempSrc: Oral  Axillary   SpO2: 100% 99% 99% 98%  Weight:      Height:        Wt Readings from Last 3 Encounters:  05/22/16 270 lb (122.5 kg)  08/14/15 270 lb (122.5 kg)  06/19/15 270 lb (122.5 kg)     Intake/Output Summary (Last 24 hours) at 05/25/16 1200 Last data filed at  05/25/16 1021  Gross per 24 hour  Intake             1370 ml  Output              900 ml  Net              470 ml    Physical Exam:   GENERAL: Pleasant-appearing in no apparent distress.  HEAD, EYES, EARS, NOSE AND THROAT: Atraumatic, normocephalic. Extraocular muscles are intact. Pupils equal and reactive to light. Sclerae anicteric. No conjunctival injection. No oro-pharyngeal erythema.  NECK: Supple. There is  no jugular venous distention. No bruits, no lymphadenopathy, no thyromegaly.  HEART: Irregularly irregular. No murmurs, no rubs, no clicks.  LUNGS: Clear to auscultation bilaterally. No rales or rhonchi. No wheezes.  ABDOMEN: Soft, flat, nontender, nondistended. Has good bowel sounds. No hepatosplenomegaly appreciated.  EXTREMITIES: Left foot dressing in place NEUROLOGIC: The patient is alert, awake, and oriented x3 with no focal motor or sensory deficits appreciated bilaterally.  SKIN: Moist and warm with no rashes appreciated.  Psych: Not anxious, depressed LN: No inguinal LN enlargement    Antibiotics   Anti-infectives    Start     Dose/Rate Route Frequency Ordered Stop   05/24/16 2200  vancomycin (VANCOCIN) 1,500 mg in sodium chloride 0.9 % 500 mL IVPB     1,500 mg 250 mL/hr over 120 Minutes Intravenous Every 12 hours 05/24/16 2112     05/23/16 2000  ceFEPIme (MAXIPIME) 2 g in dextrose 5 % 50 mL IVPB     2 g 100 mL/hr over 30 Minutes Intravenous Every 8 hours 05/23/16 1320     05/23/16 0500  vancomycin (VANCOCIN) 1,250 mg in sodium chloride 0.9 % 250 mL IVPB  Status:  Discontinued     1,250 mg 166.7 mL/hr over 90 Minutes Intravenous Every 8 hours 05/22/16 2029 05/24/16 2112   05/23/16 0200  piperacillin-tazobactam (ZOSYN) IVPB 4.5 g  Status:  Discontinued     4.5 g 200 mL/hr over 30 Minutes Intravenous Every 8 hours 05/22/16 2328 05/23/16 1320   05/22/16 2000  vancomycin (VANCOCIN) 2,000 mg in sodium chloride 0.9 % 500 mL IVPB     2,000 mg 250 mL/hr over 120  Minutes Intravenous  Once 05/22/16 1952 05/22/16 2242   05/22/16 1915  piperacillin-tazobactam (ZOSYN) IVPB 3.375 g     3.375 g 100 mL/hr over 30 Minutes Intravenous  Once 05/22/16 1914 05/22/16 1954      Medications   Scheduled Meds: . atorvastatin  80 mg Oral QPM  . ceFEPime (MAXIPIME) IV  2 g Intravenous Q8H  . diltiazem  90 mg Oral Q6H  . enoxaparin (LOVENOX) injection  40 mg Subcutaneous BID  . insulin aspart  0-9 Units Subcutaneous TID WC  . insulin glargine  36 Units Subcutaneous QHS  . metoprolol tartrate  50 mg Oral BID  . vancomycin  1,500 mg Intravenous Q12H   Continuous Infusions: PRN Meds:.acetaminophen **OR** acetaminophen, ondansetron **OR** ondansetron (ZOFRAN) IV, oxyCODONE-acetaminophen   Data Review:   Micro Results Recent Results (from the past 240 hour(s))  Culture, blood (routine x 2)     Status: None (Preliminary result)   Collection Time: 05/22/16  7:04 PM  Result Value Ref Range Status   Specimen Description BLOOD RIGHT AC  Final   Special Requests BOTTLES DRAWN AEROBIC AND ANAEROBIC BCLV  Final   Culture NO GROWTH 3 DAYS  Final   Report Status PENDING  Incomplete  Culture, blood (routine x 2)     Status: None (Preliminary result)   Collection Time: 05/22/16  7:13 PM  Result Value Ref Range Status   Specimen Description BLOOD RIGHT HAND  Final   Special Requests BOTTLES DRAWN AEROBIC AND ANAEROBIC BCLV  Final   Culture NO GROWTH 3 DAYS  Final   Report Status PENDING  Incomplete  Aerobic Culture (superficial specimen)     Status: None (Preliminary result)   Collection Time: 05/23/16  1:30 PM  Result Value Ref Range Status   Specimen Description FOOT LEFT FOOT DR COLLECTED SAMPLE  Final   Special Requests  NONE  Final   Gram Stain   Final    ABUNDANT WBC PRESENT,BOTH PMN AND MONONUCLEAR RARE GRAM POSITIVE COCCI IN PAIRS Performed at [facility], [address], [place], [place] [address]    Culture RARE STAPHYLOCOCCUS AUREUS  Final    Report Status PENDING  Incomplete   Organism ID, Bacteria STAPHYLOCOCCUS AUREUS  Final      Susceptibility   Staphylococcus aureus - MIC*    CIPROFLOXACIN >=8 RESISTANT Resistant     ERYTHROMYCIN <=0.25 SENSITIVE Sensitive     GENTAMICIN <=0.5 SENSITIVE Sensitive     OXACILLIN <=0.25 SENSITIVE Sensitive     TETRACYCLINE <=1 SENSITIVE Sensitive     VANCOMYCIN <=0.5 SENSITIVE Sensitive     TRIMETH/SULFA 20 SENSITIVE Sensitive     CLINDAMYCIN <=0.25 SENSITIVE Sensitive     RIFAMPIN <=0.5 SENSITIVE Sensitive     Inducible Clindamycin NEGATIVE Sensitive     * RARE STAPHYLOCOCCUS AUREUS    Radiology Reports Dg Ankle Complete Left  Result Date: 05/22/2016 CLINICAL DATA:  Left lower extremity redness and swelling. Plantar foot ulcer. Diabetic foot. EXAM: LEFT ANKLE COMPLETE - 3+ VIEW COMPARISON:  None. FINDINGS: Diffuse subcutaneous edema and skin thickening. Mild osteoarthritis of the ankle. Destructive changes of the first metatarsal are better assessed on concurrent foot radiograph. No bony destructive change or findings of osteomyelitis of the ankle. Multiple soft tissue phleboliths and vascular calcifications. No evidence of tibial talar joint effusion. IMPRESSION: Soft tissue edema. No evidence of osteomyelitis of the ankle. First metatarsal changes described on dedicated foot radiograph performed concurrently. Electronically Signed   By: Rubye Oaks M.D.   On: 05/22/2016 19:40   US Venous Img Lower Unilateral Left  Result Date: 05/22/2016 CLINICAL DATA:  Left leg erythema and swelling EXAM: LEFT LOWER EXTREMITY VENOUS DOPPLER ULTRASOUND TECHNIQUE: Gray-scale sonography with graded compression, as well as color Doppler and duplex ultrasound were performed to evaluate the lower extremity deep venous systems from the level of the common femoral vein and including the common femoral, femoral, profunda femoral, popliteal and calf veins including the posterior tibial, peroneal and gastrocnemius  veins when visible. The superficial great saphenous vein was also interrogated. Spectral Doppler was utilized to evaluate flow at rest and with distal augmentation maneuvers in the common femoral, femoral and popliteal veins. COMPARISON:  None. FINDINGS: Contralateral Common Femoral Vein: Respiratory phasicity is normal and symmetric with the symptomatic side. No evidence of thrombus. Normal compressibility. Common Femoral Vein: No evidence of thrombus. Normal compressibility, respiratory phasicity and response to augmentation. Saphenofemoral Junction: No evidence of thrombus. Normal compressibility and flow on color Doppler imaging. Profunda Femoral Vein: No evidence of thrombus. Normal compressibility and flow on color Doppler imaging. Femoral Vein: No evidence of thrombus. Normal compressibility, respiratory phasicity and response to augmentation. Popliteal Vein: No evidence of thrombus. Normal compressibility, respiratory phasicity and response to augmentation. Calf Veins: No evidence of thrombus. Normal compressibility and flow on color Doppler imaging. Superficial Great Saphenous Vein: No evidence of thrombus. Normal compressibility and flow on color Doppler imaging. Venous Reflux:  None. Other Findings: There are 2 lymph nodes noted in the left inguinal region measuring 3.7 x 1.6 x 1.3 cm and 3.2 x 1.6 x 2 cm. The latter is slightly more lateral in position and there is only a scant amount of hilar fat suggested. IMPRESSION: No evidence of deep venous thrombosis. Left inguinal lymphadenopathy the more lateral of which measures 3.2 x 1.6 x 2 cm and there is a suggestion  of hilar fatty replacement. Percutaneous sampling may prove useful for further evaluation. Electronically Signed   By: Tollie Eth M.D.   On: 05/22/2016 20:34   Dg Foot Complete Left  Addendum Date: 05/24/2016   ADDENDUM REPORT: 05/24/2016 00:15 ADDENDUM: Clinical findings not concerning for infection and more consistent with Charcot joint.  Bony destruction with adjacent osseous debris can be seen in the setting of Charcot arthropathy. Electronically Signed   By: Rubye Oaks M.D.   On: 05/24/2016 00:15   Result Date: 05/24/2016 CLINICAL DATA:  Left lower extremity redness and swelling. Plantar foot ulcer. Diabetic foot. EXAM: LEFT FOOT - COMPLETE 3+ VIEW COMPARISON:  None. FINDINGS: Bony destructive change involves the distal first metatarsal extending to the metatarsal phalangeal joint. Prominent transverse lucency with lucencies extending to the articular surface seen and periarticular osseous fragments. Decreased density of the proximal first phalanx with bony destructive change and surrounding osseous fragments. There is associated skin and soft tissue thickening. Curvilinear densities in the soft tissues in the plantar foot may be calcifications or foreign bodies. No additional sites concerning for osteomyelitis. Diffuse soft tissue edema of the foot. Vascular calcifications are seen. IMPRESSION: Findings consistent with osteomyelitis and septic arthropathy of the first metatarsal phalangeal joint with bony destruction of the distal first metatarsal and proximal phalanx. Curvilinear densities in the subjacent plantar soft tissues may be calcifications or soft tissue foreign bodies. Electronically Signed: By: Rubye Oaks M.D. On: 05/22/2016 19:43     CBC  Recent Labs Lab 05/22/16 2005 05/23/16 0358 05/24/16 0403  WBC 8.8 8.1 6.6  HGB 10.9* 9.9* 9.8*  HCT 32.0* 29.1* 28.8*  PLT 286 285 318  MCV 90.3 90.7 90.0  MCH 30.7 30.8 30.7  MCHC 34.0 34.0 34.1  RDW 14.7* 14.7* 14.8*  LYMPHSABS 2.2  --   --   MONOABS 0.8  --   --   EOSABS 0.0  --   --   BASOSABS 0.1  --   --     Chemistries   Recent Labs Lab 05/22/16 1949 05/23/16 0358 05/24/16 0403 05/25/16 0350  NA 135 136 138 138  K 4.1 3.7 3.6 3.6  CL 101 106 107 107  CO2 25 24 24 23   GLUCOSE 225* 236* 151* 186*  BUN 14 16 14 15   CREATININE 0.79 0.84 0.82  0.87  CALCIUM 9.0 8.5* 8.5* 8.5*  AST 16  --   --   --   ALT 11*  --   --   --   ALKPHOS 74  --   --   --   BILITOT 0.7  --   --   --    ------------------------------------------------------------------------------------------------------------------ estimated creatinine clearance is 118.9 mL/min (by C-G formula based on SCr of 0.87 mg/dL). ------------------------------------------------------------------------------------------------------------------  Recent Labs  05/23/16 0358  HGBA1C 8.4*   ------------------------------------------------------------------------------------------------------------------ No results for input(s): CHOL, HDL, LDLCALC, TRIG, CHOLHDL, LDLDIRECT in the last 72 hours. ------------------------------------------------------------------------------------------------------------------ No results for input(s): TSH, T4TOTAL, T3FREE, THYROIDAB in the last 72 hours.  Invalid input(s): FREET3 ------------------------------------------------------------------------------------------------------------------ No results for input(s): VITAMINB12, FOLATE, FERRITIN, TIBC, IRON, RETICCTPCT in the last 72 hours.  Coagulation profile  Recent Labs Lab 05/22/16 1904  INR 1.33    No results for input(s): DDIMER in the last 72 hours.  Cardiac Enzymes No results for input(s): CKMB, TROPONINI, MYOGLOBIN in the last 168 hours.  Invalid input(s): CK ------------------------------------------------------------------------------------------------------------------ Invalid input(s): POCBNP    Assessment & Plan  Patient is a 56 year old African-American male with diabetes presenting  with foot swelling 1. Suspected Osteomyelitis (HCC) - Continue antibiotics for now , patient's symptoms could be due to due to  Charcot arthropathy, Few bacteria of staph species noted from the drainage yesterday. I will await podiatry input in terms of how significant this finding is. Will  continue antibiotics for now Further recommendations and discharge pending podiatry input  2.  HTN (hypertension) - continue Cardizem blood pressure stable  3.   Diabetes (HCC) - on sliding scale and lantus  4.   CAD (coronary artery disease) - continue home meds   5.Paroxysmal atrial fibrillation (HCC) -heart rate improved with adjustment of his medications continue current therapy    Code Status Orders        Start     Ordered   05/22/16 2324  Full code  Continuous     05/22/16 2323    Code Status History    Date Active Date Inactive Code Status Order ID Comments User Context   This patient has a current code status but no historical code status.           Consults  podiatry  DVT Prophylaxis  SCDs     Lab Results  Component Value Date   PLT 318 05/24/2016     Time Spent in minutes   Greater than 50% of time spent in care coordination and counseling patient regarding the condition and plan of care.   Auburn Bilberry M.D on 05/25/2016 at 12:00 PM  Between 7am to 6pm - Pager - [phone]  After 6pm go to [email or web address] - password [email or web address] [facility]  [facility]   Office  [phone]

## 2016-05-26 LAB — BASIC METABOLIC PANEL
ANION GAP: 7 (ref 5–15)
BUN: 15 mg/dL (ref 6–20)
CALCIUM: 8.6 mg/dL — AB (ref 8.9–10.3)
CO2: 24 mmol/L (ref 22–32)
Chloride: 107 mmol/L (ref 101–111)
Creatinine, Ser: 0.92 mg/dL (ref 0.61–1.24)
Glucose, Bld: 283 mg/dL — ABNORMAL HIGH (ref 65–99)
POTASSIUM: 4 mmol/L (ref 3.5–5.1)
Sodium: 138 mmol/L (ref 135–145)

## 2016-05-26 LAB — AEROBIC CULTURE W GRAM STAIN (SUPERFICIAL SPECIMEN)

## 2016-05-26 LAB — AEROBIC CULTURE  (SUPERFICIAL SPECIMEN)

## 2016-05-26 LAB — GLUCOSE, CAPILLARY: Glucose-Capillary: 231 mg/dL — ABNORMAL HIGH (ref 65–99)

## 2016-05-26 MED ORDER — OXYCODONE-ACETAMINOPHEN 5-325 MG PO TABS: 1.0000 | ORAL_TABLET | Freq: Four times a day (QID) | ORAL | 0 refills | Status: AC | PRN

## 2016-05-26 MED ORDER — CEPHALEXIN 500 MG PO CAPS: 500.0000 mg | ORAL_CAPSULE | Freq: Three times a day (TID) | ORAL | 0 refills | Status: AC

## 2016-05-26 NOTE — Plan of Care (Signed)
Problem: Tissue Perfusion: Goal: Adequacy of tissue perfusion will improve Outcome: Completed/Met Date Met: 05/26/16 Pt has met goals for discharge.

## 2016-05-26 NOTE — Discharge Summary (Signed)
[facility] [employer] - [place] at [facility], [place] y.o., DOB 12-06-1960, MRN [id number]. Admission date: 05/22/2016 Discharge Date 05/26/2016 Primary MD No PCP Per Patient Admitting Physician Oralia Manis, MD  Admission Diagnosis  Permanent atrial fibrillation (HCC) [I48.2] Sepsis, due to unspecified organism [facility]) [A41.9] Septic arthritis of left foot, due to unspecified organism [facility]) [M00.9] Other acute osteomyelitis of left foot [facility]) [M86.172]  Discharge Diagnosis   Principal Problem:   Osteomyelitis (HCC) ruled out, podiatry seems to think the patient has Charcot's joint   HTN (hypertension)   Diabetes (HCC)   CAD (coronary artery disease)   Paroxysmal atrial fibrillation [facility])           Hospital Course  patient is a 56 year old African American Meadows presented with left foot pain. He stated that he began to develop foot swelling over the last few days. Chest x-ray in the ED showed possible osteomyelitis. He was admitted for IV antibiotics and further evaluation. He was seen by Dr. Orland Jarred who evaluated his foot. He did not see any open wound to suggest osteomyelitis. His evaluation of the x-ray and patient's clinical presentation he thought it was due to charcoal  Arthropathy. Patient underwent a drainage of the fluid which just showed MSSA and was not significantly purulent according to Dr. Orland Jarred. He recommended oral antibiotics. An outpatient follow-up PA recommended activity orders and a splint to the leg. Patient currently doing better denies any complaints stable for discharge.             Consults  podiatry  Significant Tests:  See full reports for all details     Dg Ankle Complete Left  Result Date: 05/22/2016 CLINICAL DATA:  Left lower extremity redness and swelling. Plantar foot ulcer. Diabetic foot. EXAM: LEFT ANKLE COMPLETE - 3+ VIEW COMPARISON:  None. FINDINGS: Diffuse subcutaneous edema and skin thickening. Mild osteoarthritis of the  ankle. Destructive changes of the first metatarsal are better assessed on concurrent foot radiograph. No bony destructive change or findings of osteomyelitis of the ankle. Multiple soft tissue phleboliths and vascular calcifications. No evidence of tibial talar joint effusion. IMPRESSION: Soft tissue edema. No evidence of osteomyelitis of the ankle. First metatarsal changes described on dedicated foot radiograph performed concurrently. Electronically Signed   By: Rubye Oaks M.D.   On: 05/22/2016 19:40   US Venous Img Lower Unilateral Left  Result Date: 05/22/2016 CLINICAL DATA:  Left leg erythema and swelling EXAM: LEFT LOWER EXTREMITY VENOUS DOPPLER ULTRASOUND TECHNIQUE: Gray-scale sonography with graded compression, as well as color Doppler and duplex ultrasound were performed to evaluate the lower extremity deep venous systems from the level of the common femoral vein and including the common femoral, femoral, profunda femoral, popliteal and calf veins including the posterior tibial, peroneal and gastrocnemius veins when visible. The superficial great saphenous vein was also interrogated. Spectral Doppler was utilized to evaluate flow at rest and with distal augmentation maneuvers in the common femoral, femoral and popliteal veins. COMPARISON:  None. FINDINGS: Contralateral Common Femoral Vein: Respiratory phasicity is normal and symmetric with the symptomatic side. No evidence of thrombus. Normal compressibility. Common Femoral Vein: No evidence of thrombus. Normal compressibility, respiratory phasicity and response to augmentation. Saphenofemoral Junction: No evidence of thrombus. Normal compressibility and flow on color Doppler imaging. Profunda Femoral Vein: No evidence of thrombus. Normal compressibility and flow on color Doppler imaging. Femoral Vein: No evidence of thrombus. Normal compressibility, respiratory phasicity and response to augmentation. Popliteal Vein: No evidence of thrombus. Normal  compressibility, respiratory phasicity and response to augmentation. Calf Veins: No evidence of thrombus. Normal compressibility and flow on color Doppler imaging. Superficial Great Saphenous Vein: No evidence of thrombus. Normal compressibility and flow on color Doppler imaging. Venous Reflux:  None. Other Findings: There are 2 lymph nodes noted in the left inguinal region measuring 3.7 x 1.6 x 1.3 cm and 3.2 x 1.6 x 2 cm. The latter is slightly more lateral in position and there is only a scant amount of hilar fat suggested. IMPRESSION: No evidence of deep venous thrombosis. Left inguinal lymphadenopathy the more lateral of which measures 3.2 x 1.6 x 2 cm and there is a suggestion of hilar fatty replacement. Percutaneous sampling may prove useful for further evaluation. Electronically Signed   By: Tollie Ethavid  Kwon M.D.   On: 05/22/2016 20:34   Dg Foot Complete Left  Addendum Date: 05/24/2016   ADDENDUM REPORT: 05/24/2016 00:15 ADDENDUM: Clinical findings not concerning for infection and more consistent with Charcot joint. Bony destruction with adjacent osseous debris can be seen in the setting of Charcot arthropathy. Electronically Signed   By: Rubye OaksMelanie  Ehinger M.D.   On: 05/24/2016 00:15   Result Date: 05/24/2016 CLINICAL DATA:  Left lower extremity redness and swelling. Plantar foot ulcer. Diabetic foot. EXAM: LEFT FOOT - COMPLETE 3+ VIEW COMPARISON:  None. FINDINGS: Bony destructive change involves the distal first metatarsal extending to the metatarsal phalangeal joint. Prominent transverse lucency with lucencies extending to the articular surface seen and periarticular osseous fragments. Decreased density of the proximal first phalanx with bony destructive change and surrounding osseous fragments. There is associated skin and soft tissue thickening. Curvilinear densities in the soft tissues in the plantar foot may be calcifications or foreign bodies. No additional sites concerning for osteomyelitis. Diffuse  soft tissue edema of the foot. Vascular calcifications are seen. IMPRESSION: Findings consistent with osteomyelitis and septic arthropathy of the first metatarsal phalangeal joint with bony destruction of the distal first metatarsal and proximal phalanx. Curvilinear densities in the subjacent plantar soft tissues may be calcifications or soft tissue foreign bodies. Electronically Signed: By: Rubye OaksMelanie  Ehinger M.D. On: 05/22/2016 19:43       Today   Subjective:   Elvin SoWilliam Norell he states that he is feeling a little dizzy  Objective:   Blood pressure (!) 148/105, pulse 96, temperature 98 F (36.7 C), temperature source Oral, resp. rate 16, height 5\' 7"  (1.702 m), weight 270 lb (122.5 kg), SpO2 96 %.  .  Intake/Output Summary (Last 24 hours) at 05/26/16 1248 Last data filed at 05/26/16 0900  Gross per 24 hour  Intake             1200 ml  Output              300 ml  Net              900 ml    Exam VITAL SIGNS: Blood pressure (!) 148/105, pulse 96, temperature 98 F (36.7 C), temperature source Oral, resp. rate 16, height 5\' 7"  (1.702 m), weight 270 lb (122.5 kg), SpO2 96 %.  GENERAL:  56 y.o.-year-old patient lying in the bed with no acute distress.  EYES: Pupils equal, round, reactive to light and accommodation. No scleral icterus. Extraocular muscles intact.  HEENT: Head atraumatic, normocephalic. Oropharynx and nasopharynx clear.  NECK:  Supple, no jugular venous distention. No thyroid enlargement, no tenderness.  LUNGS: Normal breath sounds bilaterally, no wheezing, rales,rhonchi or crepitation. No use of accessory muscles of respiration.  CARDIOVASCULAR: S1, S2 normal. No murmurs, rubs, or gallops.  ABDOMEN: Soft, nontender, nondistended. Bowel sounds present. No organomegaly or mass.  EXTREMITIES: No pedal edema, cyanosis, or clubbing.  NEUROLOGIC: Cranial nerves II through XII are intact. Muscle strength 5/5 in all extremities. Sensation intact. Gait not checked.  PSYCHIATRIC:  The patient is alert and oriented x 3.  SKIN: No obvious rash, lesion, or ulcer.   Data Review     CBC w Diff: Lab Results  Component Value Date   WBC 6.6 05/24/2016   HGB 9.8 (L) 05/24/2016   HGB 14.3 10/20/2013   HCT 28.8 (L) 05/24/2016   HCT 42.0 10/15/2013   PLT 318 05/24/2016   PLT 139 (L) 10/15/2013   LYMPHOPCT 25 05/22/2016   LYMPHOPCT 10.6 10/15/2013   MONOPCT 9 05/22/2016   MONOPCT 9.5 10/15/2013   EOSPCT 0 05/22/2016   EOSPCT 0.2 10/15/2013   BASOPCT 1 05/22/2016   BASOPCT 0.3 10/15/2013   CMP: Lab Results  Component Value Date   NA 138 05/26/2016   NA 142 10/20/2013   K 4.0 05/26/2016   K 3.0 (L) 10/20/2013   CL 107 05/26/2016   CL 105 10/20/2013   CO2 24 05/26/2016   CO2 29 10/20/2013   BUN 15 05/26/2016   BUN 14 10/20/2013   CREATININE 0.92 05/26/2016   CREATININE 1.02 10/20/2013   PROT 7.8 05/22/2016   PROT 6.8 10/14/2013   ALBUMIN 3.2 (L) 05/22/2016   ALBUMIN 2.4 (L) 10/14/2013   BILITOT 0.7 05/22/2016   BILITOT 1.7 (H) 10/14/2013   ALKPHOS 74 05/22/2016   ALKPHOS 82 10/14/2013   AST 16 05/22/2016   AST 314 (H) 10/14/2013   ALT 11 (L) 05/22/2016   ALT 916 (H) 10/14/2013  .  Micro Results Recent Results (from the past 240 hour(s))  Culture, blood (routine x 2)     Status: None (Preliminary result)   Collection Time: 05/22/16  7:04 PM  Result Value Ref Range Status   Specimen Description BLOOD RIGHT AC  Final   Special Requests BOTTLES DRAWN AEROBIC AND ANAEROBIC BCLV  Final   Culture NO GROWTH 4 DAYS  Final   Report Status PENDING  Incomplete  Culture, blood (routine x 2)     Status: None (Preliminary result)   Collection Time: 05/22/16  7:13 PM  Result Value Ref Range Status   Specimen Description BLOOD RIGHT HAND  Final   Special Requests BOTTLES DRAWN AEROBIC AND ANAEROBIC BCLV  Final   Culture NO GROWTH 4 DAYS  Final   Report Status PENDING  Incomplete  Aerobic Culture (superficial specimen)     Status: None   Collection Time:  05/23/16  1:30 PM  Result Value Ref Range Status   Specimen Description FOOT LEFT FOOT DR COLLECTED SAMPLE  Final   Special Requests NONE  Final   Gram Stain   Final    ABUNDANT WBC PRESENT,BOTH PMN AND MONONUCLEAR RARE GRAM POSITIVE COCCI IN PAIRS Performed at [facility], [address] [place], [place] [address]    Culture RARE STAPHYLOCOCCUS AUREUS  Final   Report Status 05/26/2016 FINAL  Final   Organism ID, Bacteria STAPHYLOCOCCUS AUREUS  Final      Susceptibility   Staphylococcus aureus - MIC*    CIPROFLOXACIN >=8 RESISTANT Resistant     ERYTHROMYCIN <=0.25 SENSITIVE Sensitive     GENTAMICIN <=0.5 SENSITIVE Sensitive     OXACILLIN <=0.25 SENSITIVE Sensitive     TETRACYCLINE <=1 SENSITIVE Sensitive  VANCOMYCIN <=0.5 SENSITIVE Sensitive     TRIMETH/SULFA 20 SENSITIVE Sensitive     CLINDAMYCIN <=0.25 SENSITIVE Sensitive     RIFAMPIN <=0.5 SENSITIVE Sensitive     Inducible Clindamycin NEGATIVE Sensitive     * RARE STAPHYLOCOCCUS AUREUS        Code Status Orders        Start     Ordered   05/22/16 2324  Full code  Continuous     05/22/16 2323    Code Status History    Date Active Date Inactive Code Status Order ID Comments User Context   This patient has a current code status but no historical code status.          Follow-up Information    Epimenio Sarin, DPM On 06/05/2016.   Specialty:  Podiatry Why:  215 Contact information: [address] [facility] [place] [address] [phone]           Discharge Medications   Allergies as of 05/26/2016      Reactions   Lisinopril Cough   Latex Rash      Medication List    STOP taking these medications   sulfamethoxazole-trimethoprim 800-160 MG tablet Commonly known as:  BACTRIM DS,SEPTRA DS     TAKE these medications   apixaban 5 MG Tabs tablet Commonly known as:  ELIQUIS Take 5 mg by mouth 2 (two) times daily.   atorvastatin 80 MG tablet Commonly  known as:  LIPITOR Take 80 mg by mouth every evening.   cephALEXin 500 MG capsule Commonly known as:  KEFLEX Take 1 capsule (500 mg total) by mouth 3 (three) times daily.   diltiazem 240 MG 24 hr capsule Commonly known as:  DILACOR XR Take 240 mg by mouth daily.   insulin aspart 100 UNIT/ML injection Commonly known as:  novoLOG Inject 12 Units into the skin 3 (three) times daily before meals.   insulin glargine 100 UNIT/ML injection Commonly known as:  LANTUS Inject 36 Units into the skin at bedtime.   losartan 100 MG tablet Commonly known as:  COZAAR Take 100 mg by mouth daily.   metFORMIN 500 MG 24 hr tablet Commonly known as:  GLUCOPHAGE-XR Take 1,000 mg by mouth 2 (two) times daily.   oxyCODONE-acetaminophen 5-325 MG tablet Commonly known as:  ROXICET Take 1 tablet by mouth every 6 (six) hours as needed.   potassium chloride SA 20 MEQ tablet Commonly known as:  K-DUR,KLOR-CON Take 20 mEq by mouth daily.          Total Time in preparing paper work, data evaluation and todays exam - 35 minutes  Auburn Bilberry M.D on 05/26/2016 at 12:48 PM  [facility]   Office  [phone]

## 2016-05-26 NOTE — Progress Notes (Signed)
Pt placed on [facility] C-1 CPAP. CPAP plugged into red outlet. Pt tolerating well 

## 2016-05-26 NOTE — Discharge Instructions (Signed)
[facility] - [place] at [facility]  DIET:  Diabetic diet  DISCHARGE CONDITION:  Stable  ACTIVITY:  BRP, transfer to chair etc.  Has a knee rest scooter at home and can use it.   OXYGEN:  Home Oxygen: No.   Oxygen Delivery: room air  DISCHARGE LOCATION:  home    ADDITIONAL DISCHARGE INSTRUCTION:   If you experience worsening of your admission symptoms, develop shortness of breath, life threatening emergency, suicidal or homicidal thoughts you must seek medical attention immediately by calling 911 or calling your MD immediately  if symptoms less severe.  You Must read complete instructions/literature along with all the possible adverse reactions/side effects for all the Medicines you take and that have been prescribed to you. Take any new Medicines after you have completely understood and accpet all the possible adverse reactions/side effects.   Please note  You were cared for by a hospitalist during your hospital stay. If you have any questions about your discharge medications or the care you received while you were in the hospital after you are discharged, you can call the unit and asked to speak with the hospitalist on call if the hospitalist that took care of you is not available. Once you are discharged, your primary care physician will handle any further medical issues. Please note that NO REFILLS for any discharge medications will be authorized once you are discharged, as it is imperative that you return to your primary care physician (or establish a relationship with a primary care physician if you do not have one) for your aftercare needs so that they can reassess your need for medications and monitor your lab values.

## 2016-05-26 NOTE — Evaluation (Signed)
Physical Therapy Evaluation Patient Details Name: Ralph Meadows MRN: [id number] DOB: 1960-12-23 Today's Date: 05/26/2016   History of Present Illness  56 y/o male here with L foot pain, found to have osteomyelitis.  Pt had a ulceration a few months ago and was using a knee scooter.  Clinical Impression  Pt struggled to consistently keep weight off L LE during any transfers, etc and needed heavy cuing and explanation to insure that he increased awareness and respect for this.  Pt did well with knee scooter, was able to manage minimal distances with rolling walker and was unsafe and did poorly with attempt at using crutches.  Pt has a flight of steps to get to his bed at home, encouraged him to minimize this and try to stay on the main floor if possible as he inevitably would put too much weight through foot if he tried.       Follow Up Recommendations No PT follow up    Equipment Recommendations       Recommendations for Other Services       Precautions / Restrictions Precautions Precautions: Fall Restrictions LLE Weight Bearing: Non weight bearing      Mobility  Bed Mobility Overal bed mobility: Independent             General bed mobility comments: Pt able to get to EOB and dress himself at EOB  Transfers Overall transfer level: Modified independent Equipment used: Rolling walker (2 wheeled);Crutches             General transfer comment: Pt did poorly with crutches and was unsafe, better able to keep weight off L LE and stay safe with walker  Ambulation/Gait Ambulation/Gait assistance: Min guard;Supervision Ambulation Distance (Feet): 20 Feet Assistive device: Rolling walker (2 wheeled)       General Gait Details: Pt was able to ambulate with walker and keep weight off L LE relatively well (he was inconsistent and did admit that he struggles with hopping/using UEs enough to stay NWBing.  He was able to negotiate >100 ft with knee scooter w/o issue.  Stairs            Wheelchair Mobility    Modified Rankin (Stroke Patients Only)       Balance Overall balance assessment: Modified Independent                                           Pertinent Vitals/Pain Pain Assessment: 0-10 Pain Score: 4  Pain Location: L foot    Home Living Family/patient expects to be discharged to:: Private residence Living Arrangements: Spouse/significant other;Other relatives     Home Access: Level entry     Home Layout: Two level (reports he can stay on the main floor, sleep on couch) Home Equipment: Walker - 2 wheels (knee scooter)      Prior Function Level of Independence: Independent         Comments: Pt works, on feet most of the day, generally able to be active     Hand Dominance        Extremity/Trunk Assessment   Upper Extremity Assessment Upper Extremity Assessment: Overall WFL for tasks assessed    Lower Extremity Assessment Lower Extremity Assessment: Generalized weakness;Overall WFL for tasks assessed (L foot/ankle not tested, otherwise [facility])       Communication   Communication: No difficulties  Cognition Arousal/Alertness: Awake/alert Behavior During  Therapy: WFL for tasks assessed/performed Overall Cognitive Status: Within Functional Limits for tasks assessed                      General Comments      Exercises     Assessment/Plan    PT Assessment Patient needs continued PT services  PT Problem List Decreased strength;Decreased range of motion;Decreased activity tolerance;Decreased balance;Decreased knowledge of use of DME;Decreased safety awareness;Decreased mobility;Pain       PT Treatment Interventions Gait training;DME instruction;Functional mobility training;Therapeutic activities;Therapeutic exercise;Balance training;Cognitive remediation;Patient/family education    PT Goals (Current goals can be found in the Care Plan section)  Acute Rehab PT Goals Patient Stated Goal:  go home PT Goal Formulation: With patient Time For Goal Achievement: 06/09/16 Potential to Achieve Goals: Good    Frequency Min 2X/week   Barriers to discharge        Co-evaluation               End of Session Equipment Utilized During Treatment: Gait belt Activity Tolerance: Patient tolerated treatment well Patient left: with chair alarm set;with call bell/phone within reach   PT Visit Diagnosis: Muscle weakness (generalized) (M62.81);Difficulty in walking, not elsewhere classified (R26.2)         Time: [id number] PT Time Calculation (min) (ACUTE ONLY): 33 min   Charges:   PT Evaluation $PT Eval Low Complexity: 1 Procedure PT Treatments $Gait Training: 8-22 mins   PT G Codes:         Malachi ProGalen R Vanecia Limpert, DPT 05/26/2016, 10:20 AM

## 2016-05-26 NOTE — Care Management Note (Signed)
Case Management Note  Patient Details  Name: Ralph Meadows MRN: [id number] Date of Birth: Oct 05, 1960  Subjective/Objective:   Spoke with patient and he is agreeable to home health. Referral to Clydie Braun with [facility] for nursing. PT recommended no follow up. Spoke with Fredrik Cove [phone]) at the [facility] who will get order for home health and ensure approval.                  Action/Plan: [facility] for nursing.   Expected Discharge Date:  05/26/2016                Expected Discharge Plan:  Home w Home Health Services  In-House Referral:     Discharge planning Services  CM Consult  Post Acute Care Choice:  Home Health Choice offered to:  Patient (transfer to [facility] options provided)  DME Arranged:    DME Agency:     HH Arranged:  RN HH Agency:  [facility]  Status of Service:  Completed, signed off  If discussed at [employer] of Stay Meetings, dates discussed:    Additional Comments:  Marily Memos, RN 05/26/2016, 10:14 AM

## 2016-05-26 NOTE — Progress Notes (Signed)
Shift assessment completed at 0730. Pt easily awakened, is using cpap, takes this off. Pt in no distress, lungs are clear, Hr is irregular, pt is on tele, cardizem held due to hr of 50's. Abdomen is soft, bs heard. l foot has splint and ace wrap in place, cap refill to toes is wnl, unable to palpate pulses. PIV intact to top of r hand R ppp. Pt stated he was was in little pain. Since assessment, pt was seen by Dr.Shreyang Allena KatzPatel, who informed pt of discharge home with home health. This Clinical research associatewriter also held pt's lopressor this ma and Dr. Allena KatzPatel made aware of this, pt HR between 38-62. Pt in A fib. Pt received his d/c instructions and piv was removed with catheter intact. Pt escorted to front entrance of facility via wc at approx 1245.

## 2016-05-27 LAB — CULTURE, BLOOD (ROUTINE X 2)
CULTURE: NO GROWTH
Culture: NO GROWTH

## 2016-05-30 NOTE — Care Management (Signed)
This RNCM [facility] 05/30/16 at 1445 stating that "she agrees with home health orders and [facility]".  [facility]ly[facility] Braun[facility]aren [facility] updated by phone.

## 2016-11-04 ENCOUNTER — Encounter: Payer: Self-pay | Admitting: Emergency Medicine

## 2016-11-04 ENCOUNTER — Emergency Department
Admission: EM | Admit: 2016-11-04 | Discharge: 2016-11-04 | Disposition: A | Discharge status: Home / Self Care | Payer: Medicaid Other | Attending: Emergency Medicine | Admitting: Emergency Medicine

## 2016-11-04 DIAGNOSIS — Z794 Long term (current) use of insulin: Secondary | ICD-10-CM | POA: Diagnosis not present

## 2016-11-04 DIAGNOSIS — Y92129 Unspecified place in nursing home as the place of occurrence of the external cause: Secondary | ICD-10-CM | POA: Diagnosis not present

## 2016-11-04 DIAGNOSIS — I110 Hypertensive heart disease with heart failure: Secondary | ICD-10-CM | POA: Diagnosis not present

## 2016-11-04 DIAGNOSIS — Y990 Civilian activity done for income or pay: Secondary | ICD-10-CM | POA: Diagnosis not present

## 2016-11-04 DIAGNOSIS — I509 Heart failure, unspecified: Secondary | ICD-10-CM | POA: Insufficient documentation

## 2016-11-04 DIAGNOSIS — Z91040 Latex allergy status: Secondary | ICD-10-CM | POA: Insufficient documentation

## 2016-11-04 DIAGNOSIS — Z87891 Personal history of nicotine dependence: Secondary | ICD-10-CM | POA: Diagnosis not present

## 2016-11-04 DIAGNOSIS — X500XXA Overexertion from strenuous movement or load, initial encounter: Secondary | ICD-10-CM | POA: Diagnosis not present

## 2016-11-04 DIAGNOSIS — T148XXA Other injury of unspecified body region, initial encounter: Secondary | ICD-10-CM | POA: Diagnosis not present

## 2016-11-04 DIAGNOSIS — I2510 Atherosclerotic heart disease of native coronary artery without angina pectoris: Secondary | ICD-10-CM | POA: Insufficient documentation

## 2016-11-04 DIAGNOSIS — Y93F2 Activity, caregiving, lifting: Secondary | ICD-10-CM | POA: Diagnosis not present

## 2016-11-04 DIAGNOSIS — E119 Type 2 diabetes mellitus without complications: Secondary | ICD-10-CM | POA: Diagnosis not present

## 2016-11-04 DIAGNOSIS — S3992XA Unspecified injury of lower back, initial encounter: Secondary | ICD-10-CM | POA: Diagnosis present

## 2016-11-04 DIAGNOSIS — Z7901 Long term (current) use of anticoagulants: Secondary | ICD-10-CM | POA: Diagnosis not present

## 2016-11-04 DIAGNOSIS — Z79899 Other long term (current) drug therapy: Secondary | ICD-10-CM | POA: Insufficient documentation

## 2016-11-04 MED ORDER — ORPHENADRINE CITRATE 30 MG/ML IJ SOLN
60.0000 mg | Freq: Two times a day (BID) | INTRAMUSCULAR | Status: DC
  Administered 2016-11-04: 60 mg via INTRAMUSCULAR
  Filled 2016-11-04: qty 2

## 2016-11-04 MED ORDER — OXYCODONE-ACETAMINOPHEN 5-325 MG PO TABS
1.0000 | ORAL_TABLET | Freq: Once | ORAL | Status: AC
  Administered 2016-11-04: 1 via ORAL
  Filled 2016-11-04: qty 1

## 2016-11-04 MED ORDER — CYCLOBENZAPRINE HCL 5 MG PO TABS: 5.0000 mg | ORAL_TABLET | Freq: Three times a day (TID) | ORAL | 0 refills | Status: AC | PRN

## 2016-11-04 MED ORDER — TRAMADOL HCL 50 MG PO TABS: 50.0000 mg | ORAL_TABLET | Freq: Four times a day (QID) | ORAL | 0 refills | Status: AC | PRN

## 2016-11-04 NOTE — ED Provider Notes (Signed)
[facility] Emergency Department Provider Note  ____________________________________________  Time seen: Approximately 5:03 PM  I have reviewed the triage vital signs and the nursing notes.   HISTORY  Chief Complaint Back Pain    [name] is a 56 y.o. male that presents to the emergency department with low back pain for one day. 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 He is concerned that he pulled something in his the right side of his back. Pain does not radiate. He took 800 mg of ibuprofen this morning but he is on Eliquis so he is not supposed to take ibuprofen.No bowel or bladder dysfunction or saddle paresthesias. No shortness of breath, chest pain, nausea, vomiting, abdominal pain,dysuria, urgency, frequency, numbness, tingling.   Past Medical History:  Diagnosis Date  . Atrial fibrillation (HCC)   . CHF (congestive heart failure) (HCC)   . Coronary artery disease   . Diabetes mellitus without complication (HCC)   . DVT (deep venous thrombosis) (HCC)   . Hypertension     Patient Active Problem List   Diagnosis Date Noted  . HTN (hypertension) 05/22/2016  . Diabetes (HCC) 05/22/2016  . CAD (coronary artery disease) 05/22/2016  . Paroxysmal atrial fibrillation (HCC) 05/22/2016  . Osteomyelitis (HCC) 05/22/2016    Past Surgical History:  Procedure Laterality Date  . NO PAST SURGERIES      Prior to Admission medications   Medication Sig Start Date End Date Taking? Authorizing Provider  apixaban (ELIQUIS) 5 MG TABS tablet Take 5 mg by mouth 2 (two) times daily.    [provider]  atorvastatin (LIPITOR) 80 MG tablet Take 80 mg by mouth every evening.    [provider]  cyclobenzaprine (FLEXERIL) 5 MG tablet Take 1 tablet (5 mg total) by mouth 3 (three) times  daily as needed for muscle spasms. 11/04/16 11/11/16  Enid Derry, PA-C  diltiazem (DILACOR XR) 240 MG 24 hr capsule Take 240 mg by mouth daily.    [provider]  insulin aspart (NOVOLOG) 100 UNIT/ML injection Inject 12 Units into the skin 3 (three) times daily before meals.    [provider]  insulin glargine (LANTUS) 100 UNIT/ML injection Inject 36 Units into the skin at bedtime.    [provider]  losartan (COZAAR) 100 MG tablet Take 100 mg by mouth daily.    [provider]  metFORMIN (GLUCOPHAGE-XR) 500 MG 24 hr tablet Take 1,000 mg by mouth 2 (two) times daily.    [provider]  oxyCODONE-acetaminophen (ROXICET) 5-325 MG tablet Take 1 tablet by mouth every 6 (six) hours as needed. 05/26/16   Auburn Bilberry, MD  potassium chloride SA (K-DUR,KLOR-CON) 20 MEQ tablet Take 20 mEq by mouth daily.    [provider]  traMADol (ULTRAM) 50 MG tablet Take 1 tablet (50 mg total) by mouth every 6 (six) hours as needed. 11/04/16 11/04/17  Enid Derry, PA-C    Allergies Lisinopril and Latex  Family History  Problem Relation Age of Onset  . Hypertension Father   . Diabetes Maternal Grandfather   . Stroke Paternal Grandfather     Social History Social History  Substance Use Topics  . Smoking status: Former Games developer  . Smokeless tobacco: Never Used  . Alcohol use Yes     Comment: occasionally  Review of Systems  Constitutional: No fever/chills Cardiovascular: No chest pain. Respiratory: No SOB. Gastrointestinal: No abdominal pain.  No nausea, no vomiting.  Musculoskeletal: Positive for back pain. Skin: Negative for rash, abrasions, lacerations, ecchymosis. Neurological: Negative for headaches, numbness or tingling   ____________________________________________   PHYSICAL EXAM:  VITAL SIGNS: ED Triage Vitals  Enc Vitals Group     BP 11/04/16 1549 (!) 121/95     Pulse Rate 11/04/16 1549 96     Resp 11/04/16 1549 18      Temp 11/04/16 1549 98.1 F (36.7 C)     Temp Source 11/04/16 1549 Oral     SpO2 11/04/16 1549 96 %     Weight 11/04/16 1550 270 lb (122.5 kg)     Height 11/04/16 1550 5\' 8"  (1.727 m)     Head Circumference --      Peak Flow --      Pain Score 11/04/16 1549 7     Pain Loc --      Pain Edu? --      Excl. in GC? --      Constitutional: Alert and oriented. Well appearing and in no acute distress. Eyes: Conjunctivae are normal. PERRL. EOMI. Head: Atraumatic. ENT:      Ears:      Nose: No congestion/rhinnorhea.      Mouth/Throat: Mucous membranes are moist.  Neck: No stridor.  Cardiovascular: Normal rate, regular rhythm.  Good peripheral circulation. Respiratory: Normal respiratory effort without tachypnea or retractions. Lungs CTAB. Good air entry to the bases with no decreased or absent breath sounds. Gastrointestinal: Bowel sounds 4 quadrants. Soft and nontender to palpation. No guarding or rigidity. No palpable masses. No distention.  Musculoskeletal: Full range of motion to all extremities. No gross deformities appreciated. Tenderness to palpation over right paraspinal muscles. Strength 5 out of 5 in lower extremities. Neurologic:  Normal speech and language. No gross focal neurologic deficits are appreciated.  Skin:  Skin is warm, dry and intact. No rash noted.   ____________________________________________   LABS (all labs ordered are listed, but only abnormal results are displayed)  Labs Reviewed - No data to display ____________________________________________  EKG   ____________________________________________  RADIOLOGY   No results found.  ____________________________________________    PROCEDURES  Procedure(s) performed:    Procedures    Medications  orphenadrine (NORFLEX) injection 60 mg (60 mg Intramuscular Given 11/04/16 1716)  oxyCODONE-acetaminophen (PERCOCET/ROXICET) 5-325 MG per tablet 1 tablet (1 tablet Oral Given 11/04/16 1715)      ____________________________________________   INITIAL IMPRESSION / ASSESSMENT AND PLAN / ED COURSE  Pertinent labs & imaging results that were available during my care of the patient were reviewed by me and considered in my medical decision making (see chart for details).  Review of the [place] CSRS was performed in accordance of the [facility] prior to dispensing any controlled drugs.   Patient presented to the emergency department for evaluation of one day of low back pain. Symptoms are consistent with muscle strain. Vital signs and exam are reassuring. No indication for imaging at this time. No bowel or bladder dysfunction or saddle paresthesias. Pain improved with Norflex and Percocet. Patient is on Eliquis so we will NSAIDs. Patient will be discharged home with prescriptions for Flexeril and tramadol. Patient is to follow up with PCP as directed. Patient is given ED precautions to return to the ED for any worsening or new symptoms.     ____________________________________________  FINAL CLINICAL IMPRESSION(S) / ED DIAGNOSES  Final diagnoses:  Muscle strain      NEW MEDICATIONS STARTED DURING THIS VISIT:  Discharge Medication List as of 11/04/2016  5:53 PM    START taking these medications   Details  cyclobenzaprine (FLEXERIL) 5 MG tablet Take 1 tablet (5 mg total) by mouth 3 (three) times daily as needed for muscle spasms., Starting Sat 11/04/2016, Until Sat 11/11/2016, Print    traMADol (ULTRAM) 50 MG tablet Take 1 tablet (50 mg total) by mouth every 6 (six) hours as needed., Starting Sat 11/04/2016, Until Sun 11/04/2017, Print            This chart was dictated using voice recognition software/Dragon. Despite best efforts to proofread, errors can occur which can change the meaning. Any change was purely unintentional.    Enid Derry, PA-C 11/04/16 1823    Minna Antis, MD 11/05/16 [phone]

## 2016-11-04 NOTE — ED Triage Notes (Signed)
Pt reports injuring back at work last night. 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 Pt reports mid back pain since last night. Pt ambulatory to triage. No apparent distress noted.

## 2019-01-10 ENCOUNTER — Ambulatory Visit (LOCAL_COMMUNITY_HEALTH_CENTER): Payer: Self-pay

## 2019-01-10 ENCOUNTER — Other Ambulatory Visit: Payer: Self-pay

## 2019-01-10 DIAGNOSIS — Z111 Encounter for screening for respiratory tuberculosis: Secondary | ICD-10-CM

## 2019-01-13 ENCOUNTER — Ambulatory Visit (LOCAL_COMMUNITY_HEALTH_CENTER): Payer: Non-veteran care

## 2019-01-13 ENCOUNTER — Other Ambulatory Visit: Payer: Self-pay

## 2019-01-13 DIAGNOSIS — Z111 Encounter for screening for respiratory tuberculosis: Secondary | ICD-10-CM

## 2019-01-13 LAB — TB SKIN TEST
Induration: 0 mm
TB Skin Test: NEGATIVE

## 2019-02-03 ENCOUNTER — Ambulatory Visit (LOCAL_COMMUNITY_HEALTH_CENTER): Payer: Non-veteran care

## 2019-02-03 ENCOUNTER — Other Ambulatory Visit: Payer: Self-pay

## 2019-02-03 DIAGNOSIS — Z111 Encounter for screening for respiratory tuberculosis: Secondary | ICD-10-CM

## 2019-02-06 ENCOUNTER — Ambulatory Visit (LOCAL_COMMUNITY_HEALTH_CENTER): Payer: Non-veteran care

## 2019-02-06 ENCOUNTER — Other Ambulatory Visit: Payer: Self-pay

## 2019-02-06 DIAGNOSIS — Z111 Encounter for screening for respiratory tuberculosis: Secondary | ICD-10-CM

## 2019-02-06 LAB — TB SKIN TEST
Induration: 0 mm
TB Skin Test: NEGATIVE

## 2023-06-07 ENCOUNTER — Ambulatory Visit: Payer: Self-pay | Admitting: Family Medicine

## 2023-06-11 ENCOUNTER — Ambulatory Visit: Payer: Self-pay | Admitting: Family Medicine

## 2023-06-11 NOTE — Progress Notes (Deleted)
 New patient visit   Patient: Ralph Meadows.   DOB: 1961-01-03   63 y.o. Male  MRN: [id number] Visit Date: 06/11/2023  Today's healthcare provider: Ronnald Ramp, MD   No chief complaint on file.  Subjective    Ralph Ohern. is a 63 y.o. male who presents today as a new patient to establish care.      Discussed the use of AI scribe software for clinical note transcription with the patient, who gave verbal consent to proceed.  History of Present Illness              Past Medical History:  Diagnosis Date   Atrial fibrillation (HCC)    CHF (congestive heart failure) (HCC)    Coronary artery disease    Diabetes mellitus without complication (HCC)    DVT (deep venous thrombosis) (HCC)    Hypertension     Outpatient Medications Prior to Visit  Medication Sig   apixaban (ELIQUIS) 5 MG TABS tablet Take 5 mg by mouth 2 (two) times daily.   atorvastatin (LIPITOR) 80 MG tablet Take 80 mg by mouth every evening.   diltiazem (DILACOR XR) 240 MG 24 hr capsule Take 240 mg by mouth daily.   insulin aspart (NOVOLOG) 100 UNIT/ML injection Inject 12 Units into the skin 3 (three) times daily before meals.   insulin glargine (LANTUS) 100 UNIT/ML injection Inject 36 Units into the skin at bedtime.   losartan (COZAAR) 100 MG tablet Take 100 mg by mouth daily.   metFORMIN (GLUCOPHAGE-XR) 500 MG 24 hr tablet Take 1,000 mg by mouth 2 (two) times daily.   oxyCODONE-acetaminophen (ROXICET) 5-325 MG tablet Take 1 tablet by mouth every 6 (six) hours as needed.   potassium chloride SA (K-DUR,KLOR-CON) 20 MEQ tablet Take 20 mEq by mouth daily.   No facility-administered medications prior to visit.    Past Surgical History:  Procedure Laterality Date   NO PAST SURGERIES     Family Status  Relation Name Status   Father  (Not Specified)   MGF  (Not Specified)   PGF  (Not Specified)  No partnership data on file   Family History  Problem Relation Age of Onset    Hypertension Father    Diabetes Maternal Grandfather    Stroke Paternal Grandfather    Social History   Socioeconomic History   Marital status: Single    Spouse name: Not on file   Number of children: Not on file   Years of education: Not on file   Highest education level: Not on file  Occupational History   Not on file  Tobacco Use   Smoking status: Former   Smokeless tobacco: Never  Substance and Sexual Activity   Alcohol use: Yes    Comment: occasionally   Drug use: No   Sexual activity: Yes    Birth control/protection: None  Other Topics Concern   Not on file  Social History Narrative   Not on file   Social Drivers of Health   Financial Resource Strain: Not on file  Food Insecurity: Not on file  Transportation Needs: Not on file  Physical Activity: Not on file  Stress: Not on file  Social Connections: Not on file     Allergies  Allergen Reactions   Lisinopril Cough   Latex Rash    Immunization History  Administered Date(s) Administered   PPD Test 01/10/2019, 02/03/2019    Health Maintenance  Topic Date Due   Pneumococcal Vaccine 46-36 Years old (  1 of 2 - PCV) Never done   FOOT EXAM  Never done   OPHTHALMOLOGY EXAM  Never done   HIV Screening  Never done   Diabetic kidney evaluation - Urine ACR  Never done   Hepatitis C Screening  Never done   DTaP/Tdap/Td (1 - Tdap) Never done   Colonoscopy  Never done   Zoster Vaccines- Shingrix (1 of 2) Never done   HEMOGLOBIN A1C  11/20/2016   Diabetic kidney evaluation - eGFR measurement  05/26/2017   INFLUENZA VACCINE  Never done   COVID-19 Vaccine (1 - 2024-25 season) Never done   HPV VACCINES  Aged Out    Patient Care Team: Patient, No Pcp Per as PCP - General (General Practice)  Review of Systems  Last CBC Lab Results  Component Value Date   WBC 6.6 05/24/2016   HGB 9.8 (L) 05/24/2016   HCT 28.8 (L) 05/24/2016   MCV 90.0 05/24/2016   MCH 30.7 05/24/2016   RDW 14.8 (H) 05/24/2016   PLT 318  05/24/2016   Last metabolic panel Lab Results  Component Value Date   GLUCOSE 283 (H) 05/26/2016   NA 138 05/26/2016   K 4.0 05/26/2016   CL 107 05/26/2016   CO2 24 05/26/2016   BUN 15 05/26/2016   CREATININE 0.92 05/26/2016   GFRNONAA >60 05/26/2016   CALCIUM 8.6 (L) 05/26/2016   PHOS 2.4 (L) 10/20/2013   PROT 7.8 05/22/2016   ALBUMIN 3.2 (L) 05/22/2016   BILITOT 0.7 05/22/2016   ALKPHOS 74 05/22/2016   AST 16 05/22/2016   ALT 11 (L) 05/22/2016   ANIONGAP 7 05/26/2016   Last lipids Lab Results  Component Value Date   CHOL 168 02/18/2012   HDL 52 02/18/2012   LDLCALC 92 02/18/2012   TRIG 122 02/18/2012   Last hemoglobin A1c Lab Results  Component Value Date   HGBA1C 8.4 (H) 05/23/2016   Last thyroid functions Lab Results  Component Value Date   TSH 0.533 10/12/2013     {See past labs  Heme  Chem  Endocrine  Serology  Results Review (optional):1}   Objective    There were no vitals taken for this visit. BP Readings from Last 3 Encounters:  11/04/16 129/85  05/26/16 (!) 148/105  08/14/15 (!) 147/83   Wt Readings from Last 3 Encounters:  11/04/16 270 lb (122.5 kg)  05/22/16 270 lb (122.5 kg)  08/14/15 270 lb (122.5 kg)    {See vitals history (optional):1}    Depression Screen     No data to display         No results found for any visits on 06/11/23.   Physical Exam ***    Assessment & Plan      Problem List Items Addressed This Visit   None   Assessment and Plan               No follow-ups on file.      Ronnald Ramp, MD  [facility] [phone] (phone) [phone] (fax)  [facility]

## 2024-03-03 ENCOUNTER — Telehealth: Payer: Self-pay

## 2024-03-03 NOTE — Telephone Encounter (Signed)
 Patient was identified as falling into the [place] Measure - Diabetes.   Patient was: Attribution and/or data issue.  Validation/Investigation needed.  Explanation:  Patient is not a patient of [facility]. Patient sees [place] [facility].
